# Patient Record
Sex: Male | Born: 1985 | Race: Black or African American | Hispanic: No | Marital: Married | Smoking: Former smoker
Health system: Southern US, Community
[De-identification: ages and names within clinical notes are randomized; demographics above are authoritative.]

## PROBLEM LIST (undated history)

## (undated) DIAGNOSIS — J45909 Unspecified asthma, uncomplicated: Secondary | ICD-10-CM

## (undated) DIAGNOSIS — J40 Bronchitis, not specified as acute or chronic: Secondary | ICD-10-CM

## (undated) DIAGNOSIS — J189 Pneumonia, unspecified organism: Secondary | ICD-10-CM

## (undated) HISTORY — DX: Pneumonia, unspecified organism: J18.9

## (undated) HISTORY — DX: Bronchitis, not specified as acute or chronic: J40

---
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
## 2006-12-13 ENCOUNTER — Emergency Department (HOSPITAL_COMMUNITY): Admission: EM | Admit: 2006-12-13 | Discharge: 2006-12-13 | Payer: Self-pay | Admitting: Emergency Medicine

## 2007-08-26 ENCOUNTER — Emergency Department (HOSPITAL_COMMUNITY): Admission: EM | Admit: 2007-08-26 | Discharge: 2007-08-26 | Payer: Self-pay | Admitting: Emergency Medicine

## 2007-10-17 ENCOUNTER — Emergency Department (HOSPITAL_COMMUNITY): Admission: EM | Admit: 2007-10-17 | Discharge: 2007-10-17 | Payer: Self-pay | Admitting: Emergency Medicine

## 2008-06-30 ENCOUNTER — Emergency Department (HOSPITAL_COMMUNITY): Admission: EM | Admit: 2008-06-30 | Discharge: 2008-06-30 | Payer: Self-pay | Admitting: Emergency Medicine

## 2009-06-11 ENCOUNTER — Emergency Department (HOSPITAL_COMMUNITY): Admission: EM | Admit: 2009-06-11 | Discharge: 2009-06-11 | Payer: Self-pay | Admitting: Emergency Medicine

## 2010-09-10 ENCOUNTER — Emergency Department (HOSPITAL_COMMUNITY)
Admission: EM | Admit: 2010-09-10 | Discharge: 2010-09-10 | Discharge status: Against Medical Advice | Payer: Self-pay | Attending: Emergency Medicine | Admitting: Emergency Medicine

## 2010-09-10 DIAGNOSIS — R109 Unspecified abdominal pain: Secondary | ICD-10-CM | POA: Insufficient documentation

## 2011-04-01 LAB — BASIC METABOLIC PANEL
BUN: 17
CO2: 26
Calcium: 10.1
Chloride: 105
Creatinine, Ser: 0.88
GFR calc Af Amer: >60
GFR calc non Af Amer: >60
Glucose, Bld: 95
Potassium: 4.2
Sodium: 140

## 2011-04-01 LAB — DIFFERENTIAL
Basophils Absolute: 0
Basophils Relative: 0
Eosinophils Absolute: 0.1
Eosinophils Relative: 1
Lymphocytes Relative: 13
Lymphs Abs: 0.9
Monocytes Absolute: 0.1
Monocytes Relative: 1 — ABNORMAL LOW
Neutro Abs: 5.8
Neutrophils Relative %: 85 — ABNORMAL HIGH

## 2011-04-01 LAB — URINALYSIS, ROUTINE W REFLEX MICROSCOPIC
Glucose, UA: NEGATIVE
Hgb urine dipstick: NEGATIVE
Ketones, ur: 40 — AB
Leukocytes, UA: NEGATIVE
Nitrite: NEGATIVE
Protein, ur: 30 — AB
Specific Gravity, Urine: 1.03
Urobilinogen, UA: 0.2
pH: 5.5

## 2011-04-01 LAB — CBC
HCT: 40.8
Hemoglobin: 14.1
MCHC: 34.5
MCV: 87.7
Platelets: 266
RBC: 4.66
RDW: 15.2
WBC: 6.9

## 2011-04-01 LAB — URINE MICROSCOPIC-ADD ON

## 2011-04-11 LAB — URINALYSIS, ROUTINE W REFLEX MICROSCOPIC
Bilirubin Urine: NEGATIVE
Glucose, UA: NEGATIVE mg/dL
Hgb urine dipstick: NEGATIVE
Ketones, ur: NEGATIVE mg/dL
Nitrite: NEGATIVE
Protein, ur: NEGATIVE mg/dL
Specific Gravity, Urine: 1.029 (ref 1.005–1.030)
Urobilinogen, UA: 1 mg/dL (ref 0.0–1.0)
pH: 6 (ref 5.0–8.0)

## 2011-04-11 LAB — CBC
HCT: 35.2 % — ABNORMAL LOW (ref 39.0–52.0)
Hemoglobin: 11.9 g/dL — ABNORMAL LOW (ref 13.0–17.0)
MCHC: 33.7 g/dL (ref 30.0–36.0)
MCV: 92.2 fL (ref 78.0–100.0)
Platelets: 201 10*3/uL (ref 150–400)
RBC: 3.82 MIL/uL — ABNORMAL LOW (ref 4.22–5.81)
RDW: 13.7 % (ref 11.5–15.5)
WBC: 7.4 10*3/uL (ref 4.0–10.5)

## 2011-04-11 LAB — URINE MICROSCOPIC-ADD ON

## 2011-04-11 LAB — DIFFERENTIAL
Basophils Absolute: 0 10*3/uL (ref 0.0–0.1)
Basophils Relative: 1 % (ref 0–1)
Eosinophils Absolute: 0.1 10*3/uL (ref 0.0–0.7)
Eosinophils Relative: 1 % (ref 0–5)
Lymphocytes Relative: 31 % (ref 12–46)
Lymphs Abs: 2.2 10*3/uL (ref 0.7–4.0)
Monocytes Absolute: 0.4 10*3/uL (ref 0.1–1.0)
Monocytes Relative: 6 % (ref 3–12)
Neutro Abs: 4.5 10*3/uL (ref 1.7–7.7)
Neutrophils Relative %: 62 % (ref 43–77)

## 2011-04-11 LAB — URINE CULTURE: Colony Count: 3000

## 2011-04-11 LAB — COMPREHENSIVE METABOLIC PANEL
ALT: 10 U/L (ref 0–53)
AST: 16 U/L (ref 0–37)
Albumin: 4.1 g/dL (ref 3.5–5.2)
Alkaline Phosphatase: 56 U/L (ref 39–117)
BUN: 18 mg/dL (ref 6–23)
CO2: 25 mEq/L (ref 19–32)
Calcium: 9.4 mg/dL (ref 8.4–10.5)
Chloride: 107 mEq/L (ref 96–112)
Creatinine, Ser: 0.79 mg/dL (ref 0.4–1.5)
GFR calc Af Amer: >60 mL/min (ref >60)
GFR calc non Af Amer: >60 mL/min (ref >60)
Glucose, Bld: 125 mg/dL — ABNORMAL HIGH (ref 70–99)
Potassium: 3.5 mEq/L (ref 3.5–5.1)
Sodium: 136 mEq/L (ref 135–145)
Total Bilirubin: 0.4 mg/dL (ref 0.3–1.2)
Total Protein: 6.6 g/dL (ref 6.0–8.3)

## 2011-04-11 LAB — GC/CHLAMYDIA PROBE AMP, GENITAL
Chlamydia, DNA Probe: NEGATIVE
GC Probe Amp, Genital: NEGATIVE

## 2011-04-24 LAB — GC/CHLAMYDIA PROBE AMP, GENITAL
Chlamydia, DNA Probe: POSITIVE — AB
GC Probe Amp, Genital: NEGATIVE

## 2011-07-08 DIAGNOSIS — J189 Pneumonia, unspecified organism: Secondary | ICD-10-CM

## 2011-07-08 HISTORY — DX: Pneumonia, unspecified organism: J18.9

## 2012-03-13 ENCOUNTER — Emergency Department (HOSPITAL_COMMUNITY)
Admission: EM | Admit: 2012-03-13 | Discharge: 2012-03-13 | Disposition: A | Discharge status: Home / Self Care | Payer: Self-pay | Attending: Emergency Medicine | Admitting: Emergency Medicine

## 2012-03-13 ENCOUNTER — Encounter (HOSPITAL_COMMUNITY): Payer: Self-pay | Admitting: Emergency Medicine

## 2012-03-13 ENCOUNTER — Emergency Department (HOSPITAL_COMMUNITY): Payer: Self-pay

## 2012-03-13 DIAGNOSIS — J189 Pneumonia, unspecified organism: Secondary | ICD-10-CM | POA: Insufficient documentation

## 2012-03-13 DIAGNOSIS — F17200 Nicotine dependence, unspecified, uncomplicated: Secondary | ICD-10-CM | POA: Insufficient documentation

## 2012-03-13 MED ORDER — AZITHROMYCIN 250 MG PO TABS: 250.0000 mg | ORAL_TABLET | Freq: Every day | ORAL | Status: AC

## 2012-03-13 NOTE — ED Notes (Signed)
Pt describes low back pain, general malaise, nausea w/o emesis, cough, right side of chest is sore from coughing, headache off and on. Sx started last Sunday. Rx w/ ibuprofen only w/o relief

## 2012-03-13 NOTE — ED Provider Notes (Signed)
History     CSN: [id number]  Arrival date & time 03/13/12  1543   First MD Initiated Contact with Patient 03/13/12 1813      Chief Complaint  Patient presents with  . Influenza    (Consider location/radiation/quality/duration/timing/severity/associated sxs/prior treatment) HPI Comments: Patient presents with a one week history of general malaise, cough, and right side chest pain. Patient reports symptoms starting one week ago and progressively worsening. He reports taking ibuprofen with some relief. He reports associated nausea and headache. He denies recent hospitalization or sick contacts.    Patient is a 26 y.o. male presenting with flu symptoms.  Influenza Associated symptoms include chest pain, coughing, fatigue, headaches and nausea.    History reviewed. No pertinent past medical history.  History reviewed. No pertinent past surgical history.  No family history on file.  History  Substance Use Topics  . Smoking status: Current Some Day Smoker  . Smokeless tobacco: Never Used  . Alcohol Use: No      Review of Systems  Constitutional: Positive for fatigue.  HENT: Positive for sneezing.   Respiratory: Positive for cough and shortness of breath.   Cardiovascular: Positive for chest pain.  Gastrointestinal: Positive for nausea.  Musculoskeletal: Positive for back pain.  Neurological: Positive for headaches.  All other systems reviewed and are negative.    Allergies  Review of patient's allergies indicates no known allergies.  Home Medications   Current Outpatient Rx  Name Route Sig Dispense Refill  . IBUPROFEN 200 MG PO TABS Oral Take 400 mg by mouth every 6 (six) hours as needed. For pain    . AZITHROMYCIN 250 MG PO TABS Oral Take 1 tablet (250 mg total) by mouth daily. Take first 2 tablets together, then 1 every day until finished. 6 tablet 0    BP 125/67  Pulse 63  Temp 99.2 F (37.3 C) (Oral)  Resp 18  SpO2 98%  Physical Exam  Nursing note and  vitals reviewed. Constitutional: He is oriented to person, place, and time. He appears well-developed and well-nourished. No distress.  HENT:  Head: Normocephalic and atraumatic.  Mouth/Throat: Oropharynx is clear and moist. No oropharyngeal exudate.  Eyes: Conjunctivae are normal. Pupils are equal, round, and reactive to light. No scleral icterus.  Neck: Normal range of motion. Neck supple.  Cardiovascular: Normal rate and regular rhythm.  Exam reveals no gallop and no friction rub.   No murmur heard. Pulmonary/Chest: Effort normal. No respiratory distress. He exhibits no tenderness.       Wheezes and rales heard throughout lung fields bilaterally.   Musculoskeletal: Normal range of motion.  Lymphadenopathy:    He has no cervical adenopathy.  Neurological: He is alert and oriented to person, place, and time. Coordination normal.  Skin: Skin is warm and dry. He is not diaphoretic.  Psychiatric: He has a normal mood and affect. His behavior is normal.    ED Course  Procedures (including critical care time)  Labs Reviewed - No data to display Dg Chest 2 View  03/13/2012  *RADIOLOGY REPORT*  Clinical Data: 26 year old male with shortness of breath chest congestion and pain.  CHEST - 2 VIEW  Comparison: None.  Findings: Streaky and confluent right lower lobe pulmonary opacity. Lung volumes within normal limits.  Cardiac size and mediastinal contours are within normal limits.  Visualized tracheal air column is within normal limits.  No pneumothorax or edema.  No pleural effusion.  Elsewhere lung markings are within normal limits. No osseous abnormality identified.  IMPRESSION: Right lower lobe airspace opacity most resembles acute pneumonia.   Original Report Authenticated By: Harley Hallmark, M.D.      1. Community acquired pneumonia       MDM  7:39 PM Patient's chest xray reveals right lower lobe pneumonia. Pneumonia is most likely community acquired and will be treated with  azithromycin. No further evaluation needed at this time. Patient is otherwise healthy and stable for discharge.  Filed Vitals:   03/13/12 1846  BP: 125/67  Pulse: 63  Temp: 99.2 F (37.3 C)  Resp: [address], PA-C 03/13/12 1944

## 2012-03-13 NOTE — ED Provider Notes (Signed)
Medical screening examination/treatment/procedure(s) were performed by non-physician practitioner and as supervising physician I was immediately available for consultation/collaboration.   Raksha Wolfgang R Ysabela Keisler, MD 03/13/12 2232 

## 2012-06-20 ENCOUNTER — Encounter (HOSPITAL_COMMUNITY): Payer: Self-pay | Admitting: Emergency Medicine

## 2012-06-20 ENCOUNTER — Emergency Department (HOSPITAL_COMMUNITY)
Admission: EM | Admit: 2012-06-20 | Discharge: 2012-06-20 | Discharge status: Against Medical Advice | Payer: Self-pay | Attending: Emergency Medicine | Admitting: Emergency Medicine

## 2012-06-20 ENCOUNTER — Emergency Department (HOSPITAL_COMMUNITY)
Admission: EM | Admit: 2012-06-20 | Discharge: 2012-06-20 | Disposition: A | Discharge status: Home / Self Care | Payer: Self-pay | Attending: Emergency Medicine | Admitting: Emergency Medicine

## 2012-06-20 ENCOUNTER — Encounter (HOSPITAL_COMMUNITY): Payer: Self-pay | Admitting: *Deleted

## 2012-06-20 DIAGNOSIS — F17200 Nicotine dependence, unspecified, uncomplicated: Secondary | ICD-10-CM | POA: Insufficient documentation

## 2012-06-20 DIAGNOSIS — R110 Nausea: Secondary | ICD-10-CM | POA: Insufficient documentation

## 2012-06-20 DIAGNOSIS — J45909 Unspecified asthma, uncomplicated: Secondary | ICD-10-CM | POA: Insufficient documentation

## 2012-06-20 DIAGNOSIS — R1012 Left upper quadrant pain: Secondary | ICD-10-CM | POA: Insufficient documentation

## 2012-06-20 DIAGNOSIS — R109 Unspecified abdominal pain: Secondary | ICD-10-CM

## 2012-06-20 HISTORY — DX: Unspecified asthma, uncomplicated: J45.909

## 2012-06-20 NOTE — ED Notes (Signed)
Pt states he is having abd pain that is off and on and has had this pain for about a year or so  Pt states he missed work today because of the pain  Pt states he was here earlier today but the wait was too long and he left after he was told he missed them calling him because he was asleep  Pt states the only reason he came back was to get a note so he won't loose his job

## 2012-06-20 NOTE — ED Notes (Signed)
Window tech called patient x1 and no response.

## 2012-06-20 NOTE — ED Provider Notes (Signed)
History     CSN: [id number]  Arrival date & time 06/20/12  2212   First MD Initiated Contact with Patient 06/20/12 2243      Chief Complaint  Patient presents with  . Abdominal Pain    (Consider location/radiation/quality/duration/timing/severity/associated sxs/prior treatment) HPI   Jorge Williams. is a 26 y.o. male complaining of intermittent left upper quadrant pain described as sharp like something poking poking him, off and on for one year. Patient denies fever, nausea vomiting, change in bowel or bladder habits. Patient is in no pain right now when it comes it is approximately 6/10.   Past Medical History  Diagnosis Date  . Asthma     History reviewed. No pertinent past surgical history.  History reviewed. No pertinent family history.  History  Substance Use Topics  . Smoking status: Current Some Day Smoker -- 12 years    Types: Cigars  . Smokeless tobacco: Never Used  . Alcohol Use: No      Review of Systems  Constitutional: Negative for fever.  Respiratory: Negative for shortness of breath.   Cardiovascular: Negative for chest pain.  Gastrointestinal: Positive for abdominal pain. Negative for nausea, vomiting and diarrhea.  All other systems reviewed and are negative.    Allergies  Review of patient's allergies indicates no known allergies.  Home Medications   Current Outpatient Rx  Name  Route  Sig  Dispense  Refill  . ACETAMINOPHEN 500 MG PO TABS   Oral   Take 500 mg by mouth every 6 (six) hours as needed. For pain and headache           BP 113/64  Pulse 57  Temp 98.2 F (36.8 C) (Oral)  Resp 18  SpO2 100%  Physical Exam  Nursing note and vitals reviewed. Constitutional: He is oriented to person, place, and time. He appears well-developed and well-nourished. No distress.  HENT:  Head: Normocephalic.  Eyes: Conjunctivae normal and EOM are normal.  Cardiovascular: Normal rate.   Pulmonary/Chest: Effort normal. No stridor.   Abdominal: Soft. Bowel sounds are normal. He exhibits no distension and no mass. There is no tenderness. There is no rebound and no guarding.  Musculoskeletal: Normal range of motion.  Neurological: He is alert and oriented to person, place, and time.  Psychiatric: He has a normal mood and affect.    ED Course  Procedures (including critical care time)  Labs Reviewed - No data to display No results found.   1. Abdominal pain       MDM  Patient with intermittent left upper quadrant pain for one year no associated symptoms. Patient requests work note.   Pt verbalized understanding and agrees with care plan. Outpatient follow-up and return precautions given.           Wynetta Emery, PA-C 06/20/12 2251

## 2012-06-20 NOTE — ED Notes (Signed)
Pt reports hx of the same abdominal pain a few months ago. Reports upper left abdominal  Pain 7/10 "sharp needlelike". Pressure helps relieve the pain. Reports nausea, denies vomiting or blood in stool or urine.

## 2012-06-20 NOTE — ED Notes (Signed)
Pt called to triage for phlebotomy x2 and no response.

## 2012-06-20 NOTE — ED Provider Notes (Signed)
Medical screening examination/treatment/procedure(s) were performed by non-physician practitioner and as supervising physician I was immediately available for consultation/collaboration.  Quanesha Klimaszewski, MD 06/20/12 2345 

## 2013-12-28 ENCOUNTER — Emergency Department (HOSPITAL_COMMUNITY): Payer: Self-pay

## 2013-12-28 ENCOUNTER — Encounter (HOSPITAL_COMMUNITY): Payer: Self-pay | Admitting: Emergency Medicine

## 2013-12-28 DIAGNOSIS — R52 Pain, unspecified: Secondary | ICD-10-CM | POA: Insufficient documentation

## 2013-12-28 DIAGNOSIS — J45909 Unspecified asthma, uncomplicated: Secondary | ICD-10-CM | POA: Insufficient documentation

## 2013-12-28 DIAGNOSIS — F17200 Nicotine dependence, unspecified, uncomplicated: Secondary | ICD-10-CM | POA: Insufficient documentation

## 2013-12-28 DIAGNOSIS — R079 Chest pain, unspecified: Secondary | ICD-10-CM | POA: Insufficient documentation

## 2013-12-28 LAB — BASIC METABOLIC PANEL
BUN: 17 mg/dL (ref 6–23)
CALCIUM: 9.5 mg/dL (ref 8.4–10.5)
CO2: 23 mEq/L (ref 19–32)
CREATININE: 1.01 mg/dL (ref 0.50–1.35)
Chloride: 100 mEq/L (ref 96–112)
GFR calc Af Amer: >90 mL/min (ref >90)
Glucose, Bld: 90 mg/dL (ref 70–99)
Potassium: 4 mEq/L (ref 3.7–5.3)
SODIUM: 138 meq/L (ref 137–147)

## 2013-12-28 LAB — CBC
HCT: 33.1 % — ABNORMAL LOW (ref 39.0–52.0)
Hemoglobin: 11.5 g/dL — ABNORMAL LOW (ref 13.0–17.0)
MCH: 30.2 pg (ref 26.0–34.0)
MCHC: 34.7 g/dL (ref 30.0–36.0)
MCV: 86.9 fL (ref 78.0–100.0)
PLATELETS: 201 10*3/uL (ref 150–400)
RBC: 3.81 MIL/uL — AB (ref 4.22–5.81)
RDW: 13.6 % (ref 11.5–15.5)
WBC: 4.1 10*3/uL (ref 4.0–10.5)

## 2013-12-28 LAB — I-STAT TROPONIN, ED: TROPONIN I, POC: 0 ng/mL (ref 0.00–0.08)

## 2013-12-28 MED ORDER — ALBUTEROL SULFATE (2.5 MG/3ML) 0.083% IN NEBU
5.0000 mg | INHALATION_SOLUTION | Freq: Once | RESPIRATORY_TRACT | Status: AC
  Administered 2013-12-28: 5 mg via RESPIRATORY_TRACT
  Filled 2013-12-28: qty 6

## 2013-12-28 NOTE — ED Notes (Addendum)
Pt reports chest pressure "every morning when I wake up" x weeks. States "I wake up and I feel short of breath and chest heaviness. And then it is gone for the rest of the day." Pt also reports generalized body aches, weakness and HA x 2 days. Pt noted to have bilateral wheezing, hx of asthma. Denies N/V/D, fever, chills, urinary symptoms, congestion, blurred vision or double vision. PT in NAD. Neuro intact. AO x4.

## 2013-12-29 ENCOUNTER — Emergency Department (HOSPITAL_COMMUNITY)
Admission: EM | Admit: 2013-12-29 | Discharge: 2013-12-29 | Discharge status: Against Medical Advice | Payer: Self-pay | Attending: Emergency Medicine | Admitting: Emergency Medicine

## 2013-12-29 NOTE — ED Notes (Signed)
Instructed to come back if he started to feel worse.  Stated he was feeling much better

## 2014-09-19 ENCOUNTER — Encounter (HOSPITAL_COMMUNITY): Payer: Self-pay | Admitting: Emergency Medicine

## 2014-09-19 ENCOUNTER — Emergency Department (INDEPENDENT_AMBULATORY_CARE_PROVIDER_SITE_OTHER)
Admission: EM | Admit: 2014-09-19 | Discharge: 2014-09-19 | Disposition: A | Discharge status: Home / Self Care | Payer: Self-pay | Source: Home / Self Care | Attending: Family Medicine | Admitting: Family Medicine

## 2014-09-19 DIAGNOSIS — K589 Irritable bowel syndrome without diarrhea: Secondary | ICD-10-CM

## 2014-09-19 NOTE — Discharge Instructions (Signed)
USE  Align probiotic and fiber product of choice to help abdominal problem daily. See specialist if further problems.

## 2014-09-19 NOTE — ED Notes (Signed)
Reports severe, generalized abdominal pain this am. NO pain now.  Patient also reports he has had vomiting episodes last week ( Sunday-Thursday).  No vomiting.  No pain presently

## 2014-09-19 NOTE — ED Provider Notes (Signed)
CSN: [id number]     Arrival date & time 09/19/14  [id number] History   First MD Initiated Contact with Patient 09/19/14 1038     Chief Complaint  Patient presents with  . Abdominal Pain   (Consider location/radiation/quality/duration/timing/severity/associated sxs/prior Treatment) Patient is a 29 y.o. male presenting with abdominal pain. The history is provided by the patient and the spouse.  Abdominal Pain Pain location:  Generalized Pain quality: cramping and shooting   Pain radiates to:  Does not radiate Pain severity:  Moderate Onset quality:  Sudden Duration:  2 hours Progression:  Resolved Chronicity:  Chronic Context comment:  Mult ER visits for same with neg results, sx currently resolved. Relieved by:  None tried Worsened by:  Nothing tried Associated symptoms: constipation and diarrhea   Associated symptoms: no anorexia, no chest pain, no fever, no hematemesis, no hematochezia, no melena, no nausea and no vomiting     Past Medical History  Diagnosis Date  . Asthma    History reviewed. No pertinent past surgical history. No family history on file. History  Substance Use Topics  . Smoking status: Never Smoker   . Smokeless tobacco: Not on file  . Alcohol Use: Yes    Review of Systems  Constitutional: Negative.  Negative for fever.  Cardiovascular: Negative for chest pain.  Gastrointestinal: Positive for abdominal pain, diarrhea and constipation. Negative for nausea, vomiting, melena, hematochezia, anorexia and hematemesis.  Genitourinary: Negative.     Allergies  Review of patient's allergies indicates no known allergies.  Home Medications   Prior to Admission medications   Medication Sig Start Date End Date Taking? Authorizing Provider  acetaminophen (TYLENOL) 500 MG tablet Take 500 mg by mouth every 6 (six) hours as needed. For pain and headache    Historical Provider, MD   BP 108/75 mmHg  Pulse 63  Temp(Src) 97.1 F (36.2 C) (Oral)  Resp 14  SpO2  100% Physical Exam  ED Course  Procedures (including critical care time) Labs Review Labs Reviewed - No data to display  Imaging Review No results found.   MDM   1. IBS (irritable bowel syndrome)        Linna HoffJames D Kindl, MD 09/26/14 [phone]

## 2014-09-20 ENCOUNTER — Encounter (HOSPITAL_COMMUNITY): Payer: Self-pay | Admitting: Emergency Medicine

## 2015-04-26 ENCOUNTER — Encounter (HOSPITAL_COMMUNITY): Payer: Self-pay | Admitting: Neurology

## 2015-04-26 ENCOUNTER — Emergency Department (HOSPITAL_COMMUNITY)
Admission: EM | Admit: 2015-04-26 | Discharge: 2015-04-26 | Disposition: A | Discharge status: Home / Self Care | Payer: Self-pay | Attending: Emergency Medicine | Admitting: Emergency Medicine

## 2015-04-26 DIAGNOSIS — Z720 Tobacco use: Secondary | ICD-10-CM | POA: Insufficient documentation

## 2015-04-26 DIAGNOSIS — K029 Dental caries, unspecified: Secondary | ICD-10-CM | POA: Insufficient documentation

## 2015-04-26 DIAGNOSIS — J45909 Unspecified asthma, uncomplicated: Secondary | ICD-10-CM | POA: Insufficient documentation

## 2015-04-26 DIAGNOSIS — K0889 Other specified disorders of teeth and supporting structures: Secondary | ICD-10-CM

## 2015-04-26 MED ORDER — IBUPROFEN 800 MG PO TABS: 800.0000 mg | ORAL_TABLET | Freq: Three times a day (TID) | ORAL | Status: DC

## 2015-04-26 MED ORDER — PENICILLIN V POTASSIUM 500 MG PO TABS: 500.0000 mg | ORAL_TABLET | Freq: Four times a day (QID) | ORAL | Status: DC

## 2015-04-26 NOTE — ED Notes (Signed)
Pt reports pain to right upper wisdom tooth, is painful and has a hole where he can stick his finger. Pain hurts to open mouth

## 2015-04-26 NOTE — ED Provider Notes (Signed)
CSN: [id number]     Arrival date & time 04/26/15  1513 History  By signing my name below, I, Jorge Williams, attest that this documentation has been prepared under the direction and in the presence of Shawn Joy, PA-C. Electronically Signed: Lyndel Williams, ED Scribe. 04/26/2015. 4:07 PM.   Chief Complaint  Patient presents with  . Dental Pain    The history is provided by the patient. No language interpreter was used.   HPI Comments: Jorge Williams. is a 29 y.o. male who presents to the Emergency Department complaining of sudden onset, constant, sharp, 10/10 right, upper, posterior dental pain onset 1 day ago that radiates through right side of face. Pt attributes his pain to decaying of his right upper wisdom tooth that has been present for several months. He associates a mild headache. Pt reports the pain is worse with ROM of jaw. He is not followed by a dentist and has never had dental surgery. The pt took a Goody powder yesterday without significant relief of pain. Denies trismus, trouble swallowing, bleeding or discharge from the affected area, fevers or chills, nausea or vomiting.   Past Medical History  Diagnosis Date  . Asthma    History reviewed. No pertinent past surgical history. No family history on file. Social History  Substance Use Topics  . Smoking status: Light Tobacco Smoker  . Smokeless tobacco: None  . Alcohol Use: Yes    Review of Systems  Constitutional: Negative for fever and chills.  HENT: Positive for dental problem. Negative for trouble swallowing.   Gastrointestinal: Negative for nausea and vomiting.  Neurological: Positive for headaches.  All other systems reviewed and are negative.  Allergies  Review of patient's allergies indicates no known allergies.  Home Medications   Prior to Admission medications   Medication Sig Start Date End Date Taking? Authorizing Provider  acetaminophen (TYLENOL) 500 MG tablet Take 500 mg by mouth every 6 (six)  hours as needed. For pain and headache    Historical Provider, MD  ibuprofen (ADVIL,MOTRIN) 800 MG tablet Take 1 tablet (800 mg total) by mouth 3 (three) times daily. 04/26/15   Shawn C Joy, PA-C  penicillin v potassium (VEETID) 500 MG tablet Take 1 tablet (500 mg total) by mouth 4 (four) times daily. 04/26/15   Shawn C Joy, PA-C   BP 118/77 mmHg  Pulse 51  Temp(Src) 98.7 F (37.1 C) (Oral)  Resp 16  SpO2 98% Physical Exam  Constitutional: He is oriented to person, place, and time. He appears well-developed and well-nourished. No distress.  HENT:  Head: Normocephalic.  Minor swelling to right most rear, upper molar, obvious cavity to tooth, no open sores, bleeding or discharge.   Eyes: Conjunctivae are normal.  Neck: Normal range of motion. Neck supple.  Cardiovascular: Normal rate.   Pulmonary/Chest: Effort normal. No respiratory distress.  Musculoskeletal: Normal range of motion.  Neurological: He is alert and oriented to person, place, and time. Coordination normal.  No neurologic deficits. No facial droop.  Skin: Skin is warm.  Psychiatric: He has a normal mood and affect. His behavior is normal.  Nursing note and vitals reviewed.   ED Course  Procedures  DIAGNOSTIC STUDIES: Oxygen Saturation is 98% on RA, normal by my interpretation.    COORDINATION OF CARE: 4:05 PM Discussed treatment plan with pt at bedside and pt agreed to plan. Will prescribe ibuprofen.    MDM   Final diagnoses:  Pain, dental    Generoso E Criss Alvine.  presents with right sided upper dental pain with no signs of systemic infection.  Pt does not have any signs of neurologic deficits or systemic infection. Recommended that pt seek treatment from a dentist. No indication of abscess, breathing problems, or swallowing difficulty. Pt discharged with ibuprofen and Penicillin and resources to select a dentist. Pt advised to seek dental treatment as soon as possible.   I personally performed the services  described in this documentation, which was scribed in my presence. The recorded information has been reviewed and is accurate.    Anselm PancoastShawn C Joy, PA-C 04/26/15 1710  Lorre NickAnthony Allen, MD 04/27/15 [phone]

## 2015-04-26 NOTE — ED Notes (Signed)
Patient is alert and orientedx4.  Patient was explained discharge instructions and they understood them with no questions.   

## 2015-04-26 NOTE — Discharge Instructions (Signed)
You have been seen today for dental pain. You have been discharged with prescriptions for ibuprofen and an antibiotic.  It is recommended that you follow up with a dentist as soon as possible. Return to ED should symptoms worsen or other concerns arise.

## 2015-05-09 ENCOUNTER — Emergency Department (HOSPITAL_COMMUNITY): Payer: Self-pay

## 2015-05-09 ENCOUNTER — Emergency Department (HOSPITAL_COMMUNITY)
Admission: EM | Admit: 2015-05-09 | Discharge: 2015-05-10 | Disposition: A | Discharge status: Home / Self Care | Payer: Self-pay | Attending: Emergency Medicine | Admitting: Emergency Medicine

## 2015-05-09 ENCOUNTER — Encounter (HOSPITAL_COMMUNITY): Payer: Self-pay | Admitting: Emergency Medicine

## 2015-05-09 DIAGNOSIS — Z792 Long term (current) use of antibiotics: Secondary | ICD-10-CM | POA: Insufficient documentation

## 2015-05-09 DIAGNOSIS — Z79899 Other long term (current) drug therapy: Secondary | ICD-10-CM | POA: Insufficient documentation

## 2015-05-09 DIAGNOSIS — J45901 Unspecified asthma with (acute) exacerbation: Secondary | ICD-10-CM | POA: Insufficient documentation

## 2015-05-09 DIAGNOSIS — Z720 Tobacco use: Secondary | ICD-10-CM | POA: Insufficient documentation

## 2015-05-09 MED ORDER — ALBUTEROL (5 MG/ML) CONTINUOUS INHALATION SOLN
15.0000 mg/h | INHALATION_SOLUTION | RESPIRATORY_TRACT | Status: DC
  Administered 2015-05-09: 15 mg/h via RESPIRATORY_TRACT
  Filled 2015-05-09: qty 20

## 2015-05-09 MED ORDER — PREDNISONE 20 MG PO TABS
60.0000 mg | ORAL_TABLET | Freq: Once | ORAL | Status: DC
  Filled 2015-05-09: qty 3

## 2015-05-09 MED ORDER — IPRATROPIUM-ALBUTEROL 0.5-2.5 (3) MG/3ML IN SOLN
3.0000 mL | Freq: Once | RESPIRATORY_TRACT | Status: AC
  Administered 2015-05-09: 3 mL via RESPIRATORY_TRACT
  Filled 2015-05-09: qty 3

## 2015-05-09 MED ORDER — DEXAMETHASONE SODIUM PHOSPHATE 10 MG/ML IJ SOLN
10.0000 mg | Freq: Once | INTRAMUSCULAR | Status: AC
  Administered 2015-05-10: 10 mg via INTRAMUSCULAR
  Filled 2015-05-09: qty 1

## 2015-05-09 NOTE — ED Notes (Signed)
Pt returned from radiology, sat 88%, pt appears very shob, duoneb tx started.

## 2015-05-09 NOTE — ED Provider Notes (Signed)
CSN: [id number]     Arrival date & time 05/09/15  2114 History  By signing my name below, I, Jorge Williams, attest that this documentation has been prepared under the direction and in the presence of Mckinley Olheiser, MD. Electronically Signed: Angelene Giovanni, ED Scribe. 05/09/2015. 11:22 PM.      Chief Complaint  Patient presents with  . Shortness of Breath   Patient is a 29 y.o. male presenting with shortness of breath. The history is provided by the patient. No language interpreter was used.  Shortness of Breath Severity:  Severe Onset quality:  Sudden Timing:  Intermittent Progression:  Worsening Chronicity:  Chronic Context: weather changes   Relieved by:  None tried Worsened by:  Nothing tried Ineffective treatments:  None tried Associated symptoms: sore throat and wheezing   Associated symptoms: no chest pain and no cough   Risk factors: tobacco use   Risk factors: no family hx of DVT, no prolonged immobilization and no recent surgery    HPI Comments: Jorge Williams. is a 29 y.o. male who presents to the Emergency Department complaining of gradually worsening sudden onset of intermittent wheezing and SOB onset this am. He reports associated generalized myalgia and sore throat. He reports that he normally has an asthma exacerbation with the season change. He denies ever being intubated for his asthma and normally just receives breathing treatments here. He denies any long travels. He reports that he is a current everyday smoker.   Past Medical History  Diagnosis Date  . Asthma    History reviewed. No pertinent past surgical history. No family history on file. Social History  Substance Use Topics  . Smoking status: Light Tobacco Smoker    Types: Cigars  . Smokeless tobacco: None  . Alcohol Use: Yes    Review of Systems  HENT: Positive for sore throat.   Respiratory: Positive for shortness of breath and wheezing. Negative for cough.   Cardiovascular: Negative  for chest pain.  Musculoskeletal: Positive for myalgias.  All other systems reviewed and are negative.     Allergies  Review of patient's allergies indicates no known allergies.  Home Medications   Prior to Admission medications   Medication Sig Start Date End Date Taking? Authorizing Provider  albuterol (PROVENTIL) (2.5 MG/3ML) 0.083% nebulizer solution Take 2.5 mg by nebulization every 6 (six) hours as needed for wheezing or shortness of breath.   Yes Historical Provider, MD  naproxen sodium (ANAPROX) 220 MG tablet Take 440 mg by mouth 3 (three) times daily as needed (for pain.).   Yes Historical Provider, MD  penicillin v potassium (VEETID) 500 MG tablet Take 1 tablet (500 mg total) by mouth 4 (four) times daily. 04/26/15  Yes Shawn C Joy, PA-C  ibuprofen (ADVIL,MOTRIN) 800 MG tablet Take 1 tablet (800 mg total) by mouth 3 (three) times daily. Patient not taking: Reported on 05/09/2015 04/26/15   Shawn C Joy, PA-C   BP 134/81 mmHg  Pulse 83  Temp(Src) 99.5 F (37.5 C) (Oral)  Resp 14  Ht  (1.854 m)  Wt 135 lb (61.236 kg)  BMI 17.82 kg/m2  SpO2 97% Physical Exam  Constitutional: He is oriented to person, place, and time. He appears well-developed and well-nourished. No distress.  HENT:  Head: Normocephalic and atraumatic.  Mouth/Throat: Oropharynx is clear and moist.  Eyes: Conjunctivae and EOM are normal. Pupils are equal, round, and reactive to light.  Neck: Normal range of motion. Neck supple. No tracheal deviation present.  Cardiovascular: Normal rate and regular rhythm.   Pulmonary/Chest: Effort normal. No stridor. No respiratory distress. He has wheezes. He has no rales. He exhibits no tenderness.  Occasional expiratory wheeze  Abdominal: Soft. Bowel sounds are normal. There is no rebound and no guarding.  Musculoskeletal: Normal range of motion. He exhibits no edema.  Neurological: He is alert and oriented to person, place, and time.  Skin: Skin is warm and dry.   Psychiatric: He has a normal mood and affect. His behavior is normal.  Nursing note and vitals reviewed.   ED Course  Procedures (including critical care time) DIAGNOSTIC STUDIES: Oxygen Saturation is 97% on RA, adequate by my interpretation.    COORDINATION OF CARE: 11:19 PM- Pt advised of plan for treatment and pt agrees. Pt will receive Prednisone.    Labs Review Labs Reviewed - No data to display  Imaging Review Dg Chest 2 View  05/09/2015  CLINICAL DATA:  Chest heaviness, dyspnea, nonproductive cough. Onset yesterday EXAM: CHEST  2 VIEW COMPARISON:  12/28/2013 FINDINGS: The heart size and mediastinal contours are within normal limits. Both lungs are clear. The visualized skeletal structures are unremarkable. IMPRESSION: No active cardiopulmonary disease. Electronically Signed   By: Ellery Plunkaniel R Mitchell M.D.   On: 05/09/2015 21:40   Jaykob Minichiello, MD has personally reviewed and evaluated these images and lab results as part of her medical decision-making.   EKG Interpretation   Date/Time:  Wednesday May 09 2015 22:28:11 EDT Ventricular Rate:  80 PR Interval:  169 QRS Duration: 96 QT Interval:  356 QTC Calculation: 411 R Axis:   95 Text Interpretation:  Sinus rhythm Confirmed by [facility]  MD, Herminio Kniskern  ([id number]) on 05/09/2015 11:05:41 PM      MDM   Final diagnoses:  None    Results for orders placed or performed during the hospital encounter of 12/29/13  CBC  Result Value Ref Range   WBC 4.1 4.0 - 10.5 K/uL   RBC 3.81 (L) 4.22 - 5.81 MIL/uL   Hemoglobin 11.5 (L) 13.0 - 17.0 g/dL   HCT 29.533.1 (L) 62.139.0 - 30.852.0 %   MCV 86.9 78.0 - 100.0 fL   MCH 30.2 26.0 - 34.0 pg   MCHC 34.7 30.0 - 36.0 g/dL   RDW 65.713.6 84.611.5 - 96.215.5 %   Platelets 201 150 - 400 K/uL  Basic metabolic panel  Result Value Ref Range   Sodium 138 137 - 147 mEq/L   Potassium 4.0 3.7 - 5.3 mEq/L   Chloride 100 96 - 112 mEq/L   CO2 23 19 - 32 mEq/L   Glucose, Bld 90 70 - 99 mg/dL   BUN 17 6 - 23  mg/dL   Creatinine, Ser 9.521.01 0.50 - 1.35 mg/dL   Calcium 9.5 8.4 - 84.110.5 mg/dL   GFR calc non Af Amer >90 >90 mL/min   GFR calc Af Amer >90 >90 mL/min  I-stat troponin, ED ([facility])  Result Value Ref Range   Troponin i, poc 0.00 0.00 - 0.08 ng/mL   Comment 3           Dg Chest 2 View  05/09/2015  CLINICAL DATA:  Chest heaviness, dyspnea, nonproductive cough. Onset yesterday EXAM: CHEST  2 VIEW COMPARISON:  12/28/2013 FINDINGS: The heart size and mediastinal contours are within normal limits. Both lungs are clear. The visualized skeletal structures are unremarkable. IMPRESSION: No active cardiopulmonary disease. Electronically Signed   By: Ellery Plunkaniel R Mitchell M.D.   On: 05/09/2015 21:40  Medications  albuterol (PROVENTIL,VENTOLIN) solution continuous neb (0 mg/hr Nebulization Stopped 05/10/15 0005)  ipratropium-albuterol (DUONEB) 0.5-2.5 (3) MG/3ML nebulizer solution 3 mL (3 mLs Nebulization Given 05/09/15 2145)  dexamethasone (DECADRON) injection 10 mg (10 mg Intramuscular Given 05/10/15 0002)  albuterol (PROVENTIL) (2.5 MG/3ML) 0.083% nebulizer solution 5 mg (5 mg Nebulization Given 05/10/15 0155)  ketorolac (TORADOL) injection 60 mg (60 mg Intramuscular Given 05/10/15 0155)   Clear post nebs and feels much better   Advised to stop smoking and stop marijuana.  Will give RX for inhaler and steroids.    I personally performed the services described in this documentation, which was scribed in my presence. The recorded information has been reviewed and is accurate.    Cy Blamer, MD 05/10/15 [phone]

## 2015-05-09 NOTE — ED Notes (Addendum)
Pt presents with SHOB, hx of asthma, +sore throat, cough,  accessory muscle usage noted. Speaking short sentences

## 2015-05-09 NOTE — ED Provider Notes (Signed)
10:49 PM Reviewing patients waiting to be seen. Initial o2 sat noted. Unfortunately has been waiting for extended period of time. On exam he looks fairly well though. Appeared to be sleeping when I entered room. o2 sat was 93% on RA. Mild tachypnea, but no accessory muscle usage. B/l wheezing. Continuous neb and steroids ordered.   Raeford RazorStephen Elliemae Braman, MD 05/09/15 2251

## 2015-05-10 ENCOUNTER — Encounter (HOSPITAL_COMMUNITY): Payer: Self-pay | Admitting: Emergency Medicine

## 2015-05-10 MED ORDER — ALBUTEROL SULFATE HFA 108 (90 BASE) MCG/ACT IN AERS: 1.0000 | INHALATION_SPRAY | Freq: Four times a day (QID) | RESPIRATORY_TRACT | Status: DC | PRN

## 2015-05-10 MED ORDER — KETOROLAC TROMETHAMINE 60 MG/2ML IM SOLN
60.0000 mg | Freq: Once | INTRAMUSCULAR | Status: AC
  Administered 2015-05-10: 60 mg via INTRAMUSCULAR
  Filled 2015-05-10: qty 2

## 2015-05-10 MED ORDER — PREDNISONE 20 MG PO TABS: ORAL_TABLET | ORAL | Status: DC

## 2015-05-10 MED ORDER — ALBUTEROL SULFATE (2.5 MG/3ML) 0.083% IN NEBU
5.0000 mg | INHALATION_SOLUTION | Freq: Once | RESPIRATORY_TRACT | Status: AC
  Administered 2015-05-10: 5 mg via RESPIRATORY_TRACT
  Filled 2015-05-10: qty 6

## 2015-05-10 NOTE — Discharge Instructions (Signed)

## 2015-07-06 ENCOUNTER — Emergency Department (HOSPITAL_COMMUNITY)
Admission: EM | Admit: 2015-07-06 | Discharge: 2015-07-07 | Disposition: A | Discharge status: Home / Self Care | Payer: Self-pay | Attending: Emergency Medicine | Admitting: Emergency Medicine

## 2015-07-06 DIAGNOSIS — J45909 Unspecified asthma, uncomplicated: Secondary | ICD-10-CM | POA: Insufficient documentation

## 2015-07-06 DIAGNOSIS — Z79899 Other long term (current) drug therapy: Secondary | ICD-10-CM | POA: Insufficient documentation

## 2015-07-06 DIAGNOSIS — H109 Unspecified conjunctivitis: Secondary | ICD-10-CM | POA: Insufficient documentation

## 2015-07-06 DIAGNOSIS — J029 Acute pharyngitis, unspecified: Secondary | ICD-10-CM | POA: Insufficient documentation

## 2015-07-06 DIAGNOSIS — F17210 Nicotine dependence, cigarettes, uncomplicated: Secondary | ICD-10-CM | POA: Insufficient documentation

## 2015-07-07 ENCOUNTER — Encounter (HOSPITAL_COMMUNITY): Payer: Self-pay | Admitting: Emergency Medicine

## 2015-07-07 MED ORDER — FLUORESCEIN SODIUM 1 MG OP STRP
1.0000 | ORAL_STRIP | Freq: Once | OPHTHALMIC | Status: AC
  Administered 2015-07-07: 1 via OPHTHALMIC
  Filled 2015-07-07: qty 1

## 2015-07-07 MED ORDER — TETRACAINE HCL 0.5 % OP SOLN
1.0000 [drp] | Freq: Once | OPHTHALMIC | Status: AC
  Administered 2015-07-07: 1 [drp] via OPHTHALMIC
  Filled 2015-07-07: qty 4

## 2015-07-07 MED ORDER — ERYTHROMYCIN 5 MG/GM OP OINT
1.0000 "application " | TOPICAL_OINTMENT | Freq: Once | OPHTHALMIC | Status: AC
  Administered 2015-07-07: 1 via OPHTHALMIC
  Filled 2015-07-07: qty 3.5

## 2015-07-07 NOTE — ED Provider Notes (Signed)
CSN: [id number]     Arrival date & time 07/06/15  2355 History   First MD Initiated Contact with Patient 07/07/15 0007     Chief Complaint  Patient presents with  . Eye Pain     (Consider location/radiation/quality/duration/timing/severity/associated sxs/prior Treatment) HPI Comments: 29 year old male presents to the emergency department for evaluation of left eye your rotation. He reports that his daughter was sick with pink eye last week. He states that he has had some crusting on his lashes as well as some discharge from the eye. He denies any pain, but states that he notices a slight discomfort mostly when blinking. He has had no fever. He feels as though his left eye has become more red than normal. Patient wears glasses and denies any use of contacts. He further denies trauma or injury to the eye.  Patient is a 29 y.o. male presenting with eye pain. The history is provided by the patient. No language interpreter was used.  Eye Pain This is a new problem. The current episode started yesterday. The problem occurs constantly. The problem has been gradually worsening. Associated symptoms include a sore throat (mild). Pertinent negatives include no fever, rash, swollen glands or vomiting. Associated symptoms comments: Negative for vision changes or vision loss. He has tried nothing for the symptoms.    Past Medical History  Diagnosis Date  . Asthma    History reviewed. No pertinent past surgical history. No family history on file. Social History  Substance Use Topics  . Smoking status: Light Tobacco Smoker    Types: Cigars  . Smokeless tobacco: None  . Alcohol Use: Yes    Review of Systems  Constitutional: Negative for fever.  HENT: Positive for sore throat (mild).   Eyes: Positive for discharge and redness. Negative for photophobia and visual disturbance.  Gastrointestinal: Negative for vomiting.  Skin: Negative for rash.  All other systems reviewed and are  negative.   Allergies  Food  Home Medications   Prior to Admission medications   Medication Sig Start Date End Date Taking? Authorizing Provider  albuterol (PROVENTIL HFA;VENTOLIN HFA) 108 (90 BASE) MCG/ACT inhaler Inhale 1-2 puffs into the lungs every 6 (six) hours as needed for wheezing or shortness of breath. 05/10/15  Yes April Palumbo, MD  albuterol (PROVENTIL) (2.5 MG/3ML) 0.083% nebulizer solution Take 2.5 mg by nebulization every 6 (six) hours as needed for wheezing or shortness of breath.   Yes Historical Provider, MD  ibuprofen (ADVIL,MOTRIN) 800 MG tablet Take 1 tablet (800 mg total) by mouth 3 (three) times daily. Patient not taking: Reported on 05/09/2015 04/26/15   Shawn C Joy, PA-C  penicillin v potassium (VEETID) 500 MG tablet Take 1 tablet (500 mg total) by mouth 4 (four) times daily. Patient not taking: Reported on 07/07/2015 04/26/15   Hillard DankerShawn C Joy, PA-C  predniSONE (DELTASONE) 20 MG tablet 3 tabs po day one, then 2 po daily x 4 days Patient not taking: Reported on 07/07/2015 05/10/15   April Palumbo, MD   BP 102/89 mmHg  Pulse 60  Temp(Src) 97.5 F (36.4 C) (Oral)  Resp 18  Ht 6\' 1"  (1.854 m)  Wt 63.504 kg  BMI 18.47 kg/m2  SpO2 100%   Physical Exam  Constitutional: He is oriented to person, place, and time. He appears well-developed and well-nourished. No distress.  Nontoxic/nonseptic appearing  HENT:  Head: Normocephalic and atraumatic.  Oropharynx clear. No erythema or palatal petechiae.  Eyes: EOM are normal. Pupils are equal, round, and reactive to  light. Left eye exhibits no discharge. No foreign body present in the left eye. Left conjunctiva is injected (very mild). No scleral icterus. Left eye exhibits normal extraocular motion.  Fundoscopic exam:      The left eye shows red reflex.  Slit lamp exam:      The right eye shows no fluorescein uptake.       The left eye shows no corneal abrasion, no corneal flare and no corneal ulcer.  PERRL. No proptosis  or hyphema. EOMs normal without nystagmus. No pain with eye movement. No consensual photophobia. Negative Seidel sign. No uptake on fluorescein staining of the R eye. Intraocular pressure is 15 with 95% confidence in the left eye.  Neck: Normal range of motion.  Pulmonary/Chest: Effort normal. No respiratory distress.  Musculoskeletal: Normal range of motion.  Neurological: He is alert and oriented to person, place, and time.  Skin: Skin is warm and dry. No rash noted. He is not diaphoretic. No erythema. No pallor.  Psychiatric: He has a normal mood and affect. His behavior is normal.  Nursing note and vitals reviewed.   ED Course  Procedures (including critical care time) Labs Review Labs Reviewed - No data to display  Imaging Review No results found.   I have personally reviewed and evaluated these images and lab results as part of my medical decision-making.   EKG Interpretation None      MDM   Final diagnoses:  Conjunctivitis of left eye    29 year old male presents to the emergency department for evaluation of eye irritation and redness. He denies a trauma or injury to his eye. He reports that his daughter was sick with pink eye recently. No concern for a globe rupture or iritis or uveitis. No evidence of acute glaucoma. Extraocular movements are intact. No evidence of corneal abrasion or ulcer. Symptoms consistent with conjunctivitis. Will cover for bacterial etiology with erythromycin ointment. Return precautions given at discharge. Patient discharged in good condition with no unaddressed concerns.   Filed Vitals:   07/07/15 0003  BP: 102/89  Pulse: 60  Temp: 97.5 F (36.4 C)  TempSrc: Oral  Resp: 18  Height:  (1.854 m)  Weight: 63.504 kg  SpO2: 100%     Antony Madura, PA-C 07/07/15 1610  April Palumbo, MD 07/07/15 0100

## 2015-07-07 NOTE — Discharge Instructions (Signed)
Place 1/2 inch ribbon of erythromycin ointment in the affected eye 4 times a day. You may use cool compresses and/or ibuprofen for discomfort. Follow up with an eye doctor as needed for persistent symptoms.  Bacterial Conjunctivitis Bacterial conjunctivitis, commonly called pink eye, is an inflammation of the clear membrane that covers the white part of the eye (conjunctiva). The inflammation can also happen on the underside of the eyelids. The blood vessels in the conjunctiva become inflamed, causing the eye to become red or pink. Bacterial conjunctivitis may spread easily from one eye to another and from person to person (contagious).  CAUSES  Bacterial conjunctivitis is caused by bacteria. The bacteria may come from your own skin, your upper respiratory tract, or from someone else with bacterial conjunctivitis. SYMPTOMS  The normally white color of the eye or the underside of the eyelid is usually pink or red. The pink eye is usually associated with irritation, tearing, and some sensitivity to light. Bacterial conjunctivitis is often associated with a thick, yellowish discharge from the eye. The discharge may turn into a crust on the eyelids overnight, which causes your eyelids to stick together. If a discharge is present, there may also be some blurred vision in the affected eye. DIAGNOSIS  Bacterial conjunctivitis is diagnosed by your caregiver through an eye exam and the symptoms that you report. Your caregiver looks for changes in the surface tissues of your eyes, which may point to the specific type of conjunctivitis. A sample of any discharge may be collected on a cotton-tip swab if you have a severe case of conjunctivitis, if your cornea is affected, or if you keep getting repeat infections that do not respond to treatment. The sample will be sent to a lab to see if the inflammation is caused by a bacterial infection and to see if the infection will respond to antibiotic medicines. TREATMENT    Bacterial conjunctivitis is treated with antibiotics. Antibiotic eyedrops are most often used. However, antibiotic ointments are also available. Antibiotics pills are sometimes used. Artificial tears or eye washes may ease discomfort. HOME CARE INSTRUCTIONS   To ease discomfort, apply a cool, clean washcloth to your eye for 10-20 minutes, 3-4 times a day.  Gently wipe away any drainage from your eye with a warm, wet washcloth or a cotton ball.  Wash your hands often with soap and water. Use paper towels to dry your hands.  Do not share towels or washcloths. This may spread the infection.  Change or wash your pillowcase every day.  You should not use eye makeup until the infection is gone.  Do not operate machinery or drive if your vision is blurred.  Stop using contact lenses. Ask your caregiver how to sterilize or replace your contacts before using them again. This depends on the type of contact lenses that you use.  When applying medicine to the infected eye, do not touch the edge of your eyelid with the eyedrop bottle or ointment tube. SEEK IMMEDIATE MEDICAL CARE IF:   Your infection has not improved within 3 days after beginning treatment.  You had yellow discharge from your eye and it returns.  You have increased eye pain.  Your eye redness is spreading.  Your vision becomes blurred.  You have a fever or persistent symptoms for more than 2-3 days.  You have a fever and your symptoms suddenly get worse.  You have facial pain, redness, or swelling. MAKE SURE YOU:   Understand these instructions.  Will watch your condition.  Will get help right away if you are not doing well or get worse.   This information is not intended to replace advice given to you by your health care provider. Make sure you discuss any questions you have with your health care provider.   Document Released: 06/23/2005 Document Revised: 07/14/2014 Document Reviewed: 11/24/2011 Elsevier  Interactive Patient Education [employer]

## 2015-07-07 NOTE — ED Notes (Signed)
Pt c/o left eye irritation, swelling and pain that started yesterday. Pt states his daughter had pink eye last week.

## 2015-09-16 ENCOUNTER — Encounter (HOSPITAL_COMMUNITY): Payer: Self-pay | Admitting: Emergency Medicine

## 2015-09-16 ENCOUNTER — Emergency Department (HOSPITAL_COMMUNITY)
Admission: EM | Admit: 2015-09-16 | Discharge: 2015-09-17 | Disposition: A | Discharge status: Home / Self Care | Payer: Self-pay | Attending: Emergency Medicine | Admitting: Emergency Medicine

## 2015-09-16 DIAGNOSIS — R51 Headache: Secondary | ICD-10-CM | POA: Insufficient documentation

## 2015-09-16 DIAGNOSIS — R519 Headache, unspecified: Secondary | ICD-10-CM

## 2015-09-16 DIAGNOSIS — Z87891 Personal history of nicotine dependence: Secondary | ICD-10-CM | POA: Insufficient documentation

## 2015-09-16 DIAGNOSIS — Z79899 Other long term (current) drug therapy: Secondary | ICD-10-CM | POA: Insufficient documentation

## 2015-09-16 DIAGNOSIS — R197 Diarrhea, unspecified: Secondary | ICD-10-CM | POA: Insufficient documentation

## 2015-09-16 DIAGNOSIS — R109 Unspecified abdominal pain: Secondary | ICD-10-CM | POA: Insufficient documentation

## 2015-09-16 DIAGNOSIS — J45909 Unspecified asthma, uncomplicated: Secondary | ICD-10-CM | POA: Insufficient documentation

## 2015-09-16 NOTE — ED Notes (Signed)
Patient c/o abd pain x5 days. Mid, abd cramping. Also c/o intermittent headache. Last BM 2 days ago, initially patient experienced frequent diarrhea. C/o Nausea, no emesis. Denies fever.

## 2015-09-17 LAB — LIPASE, BLOOD: Lipase: 26 U/L (ref 11–51)

## 2015-09-17 LAB — CBC
HCT: 34.3 % — ABNORMAL LOW (ref 39.0–52.0)
HEMOGLOBIN: 11.6 g/dL — AB (ref 13.0–17.0)
MCH: 29.8 pg (ref 26.0–34.0)
MCHC: 33.8 g/dL (ref 30.0–36.0)
MCV: 88.2 fL (ref 78.0–100.0)
Platelets: 280 10*3/uL (ref 150–400)
RBC: 3.89 MIL/uL — AB (ref 4.22–5.81)
RDW: 13.6 % (ref 11.5–15.5)
WBC: 6 10*3/uL (ref 4.0–10.5)

## 2015-09-17 LAB — COMPREHENSIVE METABOLIC PANEL
ALT: 16 U/L — ABNORMAL LOW (ref 17–63)
ANION GAP: 9 (ref 5–15)
AST: 19 U/L (ref 15–41)
Albumin: 4.2 g/dL (ref 3.5–5.0)
Alkaline Phosphatase: 54 U/L (ref 38–126)
BUN: 23 mg/dL — ABNORMAL HIGH (ref 6–20)
CHLORIDE: 106 mmol/L (ref 101–111)
CO2: 28 mmol/L (ref 22–32)
CREATININE: 0.94 mg/dL (ref 0.61–1.24)
Calcium: 9.7 mg/dL (ref 8.9–10.3)
Glucose, Bld: 101 mg/dL — ABNORMAL HIGH (ref 65–99)
Potassium: 3.9 mmol/L (ref 3.5–5.1)
Sodium: 143 mmol/L (ref 135–145)
Total Bilirubin: 0.4 mg/dL (ref 0.3–1.2)
Total Protein: 6.9 g/dL (ref 6.5–8.1)

## 2015-09-17 MED ORDER — DIPHENHYDRAMINE HCL 50 MG/ML IJ SOLN
12.5000 mg | Freq: Once | INTRAMUSCULAR | Status: AC
  Administered 2015-09-17: 12.5 mg via INTRAVENOUS
  Filled 2015-09-17: qty 1

## 2015-09-17 MED ORDER — SODIUM CHLORIDE 0.9 % IV BOLUS (SEPSIS)
1000.0000 mL | Freq: Once | INTRAVENOUS | Status: AC
  Administered 2015-09-17: 1000 mL via INTRAVENOUS

## 2015-09-17 MED ORDER — KETOROLAC TROMETHAMINE 15 MG/ML IJ SOLN
15.0000 mg | Freq: Once | INTRAMUSCULAR | Status: AC
  Administered 2015-09-17: 15 mg via INTRAVENOUS
  Filled 2015-09-17: qty 1

## 2015-09-17 MED ORDER — PROCHLORPERAZINE EDISYLATE 5 MG/ML IJ SOLN
10.0000 mg | Freq: Four times a day (QID) | INTRAMUSCULAR | Status: DC | PRN
  Administered 2015-09-17: 10 mg via INTRAVENOUS
  Filled 2015-09-17: qty 2

## 2015-09-17 NOTE — Discharge Instructions (Signed)

## 2015-09-17 NOTE — ED Notes (Signed)
Pt alert and oriented prior to d/c. Spoke to wife to let her know he was on his way home. Pt gait is steady. Denies feeling drowsy.

## 2015-09-17 NOTE — ED Provider Notes (Signed)
CSN: [id number]     Arrival date & time 09/16/15  2136 History  By signing my name below, I, Linus Galas, attest that this documentation has been prepared under the direction and in the presence of Raeford Razor, MD. Electronically Signed: Linus Galas, ED Scribe. 09/17/2015. 12:33 AM.    Chief Complaint  Patient presents with  . Abdominal Pain   The history is provided by the patient. No language interpreter was used.   HPI Comments: Jorge Agyeman Hyder Deman. is a 30 y.o. male with no pertinent PMHx who presents to the Emergency Department complaining of sharp abdominal pain that began 5 days ago. Pt also reports "watery stools".  Pt states that he ate at [facility] 6 days ago and began having abdominal pain a day later. Pt currently does not have any abdominal pain here in the ED. Pt has been taking Pepto-Bismol with mild relief. Pt denies any blood in his stools, urinary symptoms or any other symptoms at this time. Pt last BM was 2 days ago.   Pt also complains of an intermittent throbbing HA. He rates his HA an 8/10 in severity. Pt has been taking Tylenol with relief. He still has a HA while in the ED. Pt denies any head injuries.   Past Medical History  Diagnosis Date  . Asthma    History reviewed. No pertinent past surgical history. History reviewed. No pertinent family history. Social History  Substance Use Topics  . Smoking status: Former Smoker    Types: Cigars  . Smokeless tobacco: None  . Alcohol Use: Yes     Comment: occ    Review of Systems  Gastrointestinal: Positive for abdominal pain and diarrhea. Negative for blood in stool.  Genitourinary: Negative for urgency, frequency and difficulty urinating.  Neurological: Positive for headaches.  All other systems reviewed and are negative.  Allergies  Food  Home Medications   Prior to Admission medications   Medication Sig Start Date End Date Taking? Authorizing Provider  acetaminophen (TYLENOL) 500 MG  tablet Take 1,000 mg by mouth every 6 (six) hours as needed (for pain.).   Yes Historical Provider, MD  albuterol (PROVENTIL HFA;VENTOLIN HFA) 108 (90 BASE) MCG/ACT inhaler Inhale 1-2 puffs into the lungs every 6 (six) hours as needed for wheezing or shortness of breath. 05/10/15  Yes April Palumbo, MD  albuterol (PROVENTIL) (2.5 MG/3ML) 0.083% nebulizer solution Take 2.5 mg by nebulization every 6 (six) hours as needed for wheezing or shortness of breath.   Yes Historical Provider, MD  ibuprofen (ADVIL,MOTRIN) 800 MG tablet Take 1 tablet (800 mg total) by mouth 3 (three) times daily. Patient not taking: Reported on 05/09/2015 04/26/15   Shawn C Joy, PA-C  penicillin v potassium (VEETID) 500 MG tablet Take 1 tablet (500 mg total) by mouth 4 (four) times daily. Patient not taking: Reported on 07/07/2015 04/26/15   Hillard Danker Joy, PA-C  predniSONE (DELTASONE) 20 MG tablet 3 tabs po day one, then 2 po daily x 4 days Patient not taking: Reported on 07/07/2015 05/10/15   April Palumbo, MD   BP 117/72 mmHg  Pulse 59  Temp(Src) 97.8 F (36.6 C) (Oral)  Resp 15  SpO2 100%   Physical Exam  Constitutional: He appears well-developed and well-nourished. No distress.  HENT:  Head: Normocephalic and atraumatic.  Right Ear: External ear normal.  Left Ear: External ear normal.  Eyes: Conjunctivae are normal. Right eye exhibits no discharge. Left eye exhibits no discharge. No scleral icterus.  Neck: Neck supple. No tracheal deviation present.  Cardiovascular: Normal rate, regular rhythm and intact distal pulses.   Pulmonary/Chest: Effort normal and breath sounds normal. No stridor. No respiratory distress. He has no wheezes. He has no rales.  Abdominal: Soft. Bowel sounds are normal. He exhibits no distension. There is no tenderness. There is no rebound and no guarding.  Musculoskeletal: He exhibits no edema or tenderness.  Neurological: He is alert. He has normal strength. No cranial nerve deficit (no facial  droop, extraocular movements intact, no slurred speech) or sensory deficit. He exhibits normal muscle tone. He displays no seizure activity. Coordination normal.  Skin: Skin is warm and dry. No rash noted.  Psychiatric: He has a normal mood and affect.  Nursing note and vitals reviewed.   ED Course  Procedures   DIAGNOSTIC STUDIES: Oxygen Saturation is 100% on room air, normal by my interpretation.    COORDINATION OF CARE: 12:26 AM Will give fluids, benadryl, compazine and pain medication. Discussed treatment plan with pt at bedside and pt agreed to plan.  Labs Review Labs Reviewed  COMPREHENSIVE METABOLIC PANEL - Abnormal; Notable for the following:    Glucose, Bld 101 (*)    BUN 23 (*)    ALT 16 (*)    All other components within normal limits  CBC - Abnormal; Notable for the following:    RBC 3.89 (*)    Hemoglobin 11.6 (*)    HCT 34.3 (*)    All other components within normal limits  LIPASE, BLOOD  URINALYSIS, ROUTINE W REFLEX MICROSCOPIC (NOT AT [facility])   Imaging Review No results found. I have personally reviewed and evaluated these images and lab results as part of my medical decision-making.   EKG Interpretation None      MDM   Final diagnoses:  Nonintractable headache, unspecified chronicity pattern, unspecified headache type  Abdominal cramps    I personally preformed the services scribed in my presence. The recorded information has been reviewed is accurate. Raeford RazorStephen Kimberle Stanfill, MD.    Raeford RazorStephen Jeanett Antonopoulos, MD 09/22/15 [phone]

## 2015-09-17 NOTE — ED Notes (Signed)
Informed the pt that a urine specimen is needed. 

## 2016-09-13 ENCOUNTER — Encounter (HOSPITAL_COMMUNITY): Payer: Self-pay | Admitting: Emergency Medicine

## 2016-09-13 ENCOUNTER — Emergency Department (HOSPITAL_COMMUNITY)
Admission: EM | Admit: 2016-09-13 | Discharge: 2016-09-13 | Disposition: A | Discharge status: Home / Self Care | Payer: Self-pay | Attending: Emergency Medicine | Admitting: Emergency Medicine

## 2016-09-13 DIAGNOSIS — R21 Rash and other nonspecific skin eruption: Secondary | ICD-10-CM | POA: Insufficient documentation

## 2016-09-13 DIAGNOSIS — Z79899 Other long term (current) drug therapy: Secondary | ICD-10-CM | POA: Insufficient documentation

## 2016-09-13 DIAGNOSIS — J45909 Unspecified asthma, uncomplicated: Secondary | ICD-10-CM | POA: Insufficient documentation

## 2016-09-13 DIAGNOSIS — Z87891 Personal history of nicotine dependence: Secondary | ICD-10-CM | POA: Insufficient documentation

## 2016-09-13 NOTE — Discharge Instructions (Signed)
Use hydrocortisone cream as directed on rash. You can continue to take Benadryl 50 mg every 6 hours as needed for itch. Don't drive or operate heavy machinery while taking Benadryl as it can cause drowsiness. Call the number on these instructions to get a primary care physician and to be seen if not feeling better by next week. Or you can see an urgent care center. Ask your new primary care physician and ask you to stop smoking

## 2016-09-13 NOTE — ED Triage Notes (Signed)
Patient complains of itchy rash to bends of arm and bilateral groin x 3 days, fine rash with no drainage. Complains of itching, denies pain

## 2016-09-13 NOTE — ED Provider Notes (Signed)
[facility]-EMERGENCY DEPT Provider Note   CSN: [id number] Arrival date & time: 09/13/16  [id number]     History   Chief Complaint No chief complaint on file. Chief complaint rash  HPI Jorge E Criss AlvineJohnson Jr. is a 31 y.o. male.  HPI patient complains of rash which is pruritic and irritating on right groin and left antecubital fossa for the past 3-4 days. He reports that his wife told him that he had rash on his face as well, which has since resolved. He is treated himself with Benadryl with partial relief. He denies fever denies feeling ill denies shortness of breath. No other associated symptoms. Nothing makes symptoms better or worse  Past Medical History:  Diagnosis Date  . Asthma     There are no active problems to display for this patient.   History reviewed. No pertinent surgical history.     Home Medications    Prior to Admission medications   Medication Sig Start Date End Date Taking? Authorizing Provider  acetaminophen (TYLENOL) 500 MG tablet Take 1,000 mg by mouth every 6 (six) hours as needed (for pain.).    Historical Provider, MD  albuterol (PROVENTIL HFA;VENTOLIN HFA) 108 (90 BASE) MCG/ACT inhaler Inhale 1-2 puffs into the lungs every 6 (six) hours as needed for wheezing or shortness of breath. 05/10/15   April Palumbo, MD  albuterol (PROVENTIL) (2.5 MG/3ML) 0.083% nebulizer solution Take 2.5 mg by nebulization every 6 (six) hours as needed for wheezing or shortness of breath.    Historical Provider, MD  ibuprofen (ADVIL,MOTRIN) 800 MG tablet Take 1 tablet (800 mg total) by mouth 3 (three) times daily. Patient not taking: Reported on 05/09/2015 04/26/15   Shawn C Joy, PA-C  penicillin v potassium (VEETID) 500 MG tablet Take 1 tablet (500 mg total) by mouth 4 (four) times daily. Patient not taking: Reported on 07/07/2015 04/26/15   Hillard DankerShawn C Joy, PA-C  predniSONE (DELTASONE) 20 MG tablet 3 tabs po day one, then 2 po daily x 4 days Patient not taking: Reported on 07/07/2015 05/10/15    April Palumbo, MD    Family History No family history on file.  Social History Social History  Substance Use Topics  . Smoking status: Former Smoker    Types: Cigars  . Smokeless tobacco: Not on file  . Alcohol use Yes     Comment: occ   Positive smoker positive marijuana use no other illicit drug use  Allergies   Food   Review of Systems Review of Systems  Constitutional: Negative.   HENT: Negative.   Respiratory: Negative.   Skin: Positive for rash.  Allergic/Immunologic: Negative.   Psychiatric/Behavioral: Negative.      Physical Exam Updated Vital Signs BP 126/78 (BP Location: Right Arm)   Pulse (!) 56   Temp 98 F (36.7 C) (Oral)   Resp 18   SpO2 100%   Physical Exam  Constitutional: He is oriented to person, place, and time. He appears well-developed and well-nourished. No distress.  HENT:  Head: Normocephalic and atraumatic.  Right Ear: External ear normal.  Left Ear: External ear normal.  Nose: Nose normal.  No mucosal lesion  Neck: Neck supple.  Cardiovascular:  Mildly bradycardic  Pulmonary/Chest: Effort normal and breath sounds normal.  Abdominal: Soft. He exhibits no distension. There is no tenderness.  Genitourinary: Penis normal.  Lymphadenopathy:    He has no cervical adenopathy.  Neurological: He is oriented to person, place, and time.  Skin: Skin is warm and dry. Capillary refill takes  less than 2 seconds. Rash noted.  Minimally reddened at left antecubital and right groin. No other lesion. Reddened areas are not hot, tender or swollen. No lesions on palms or soles. No other rash  Psychiatric: He has a normal mood and affect.     ED Treatments / Results  Labs (all labs ordered are listed, but only abnormal results are displayed) Labs Reviewed - No data to display  EKG  EKG Interpretation None       Radiology No results found.  Procedures Procedures (including critical care time)  Medications Ordered in  ED Medications - No data to display   Initial Impression / Assessment and Plan / ED Course  I have reviewed the triage vital signs and the nursing notes.  Pertinent labs & imaging results that were available during my care of the patient were reviewed by me and considered in my medical decision making (see chart for details).     Patient not ill-appearing. Rash felt to be nonspecific. Suggest hydrocortisone cream in addition to Benadryl. I counseled patient for 5 minutes on smoking cessation as he has asthma and continues to smoke and use marijuana. He is referred to primary care.  Final Clinical Impressions(s) / ED Diagnoses   Final diagnoses:  Rash  Dx #1 nonspecific rash #2 tobacco abuse  New Prescriptions New Prescriptions   No medications on file     Doug Sou, MD 09/13/16 [phone]

## 2017-07-08 ENCOUNTER — Emergency Department (HOSPITAL_COMMUNITY)
Admission: EM | Admit: 2017-07-08 | Discharge: 2017-07-08 | Disposition: A | Discharge status: Home / Self Care | Payer: Self-pay | Attending: Emergency Medicine | Admitting: Emergency Medicine

## 2017-07-08 ENCOUNTER — Emergency Department (HOSPITAL_COMMUNITY): Payer: Self-pay

## 2017-07-08 ENCOUNTER — Encounter (HOSPITAL_COMMUNITY): Payer: Self-pay

## 2017-07-08 DIAGNOSIS — J45901 Unspecified asthma with (acute) exacerbation: Secondary | ICD-10-CM | POA: Insufficient documentation

## 2017-07-08 DIAGNOSIS — Z87891 Personal history of nicotine dependence: Secondary | ICD-10-CM | POA: Insufficient documentation

## 2017-07-08 LAB — CBC
HCT: 37.5 % — ABNORMAL LOW (ref 39.0–52.0)
HEMOGLOBIN: 12.5 g/dL — AB (ref 13.0–17.0)
MCH: 29 pg (ref 26.0–34.0)
MCHC: 33.3 g/dL (ref 30.0–36.0)
MCV: 87 fL (ref 78.0–100.0)
Platelets: 237 10*3/uL (ref 150–400)
RBC: 4.31 MIL/uL (ref 4.22–5.81)
RDW: 13.6 % (ref 11.5–15.5)
WBC: 6.4 10*3/uL (ref 4.0–10.5)

## 2017-07-08 LAB — BASIC METABOLIC PANEL
Anion gap: 6 (ref 5–15)
BUN: 9 mg/dL (ref 6–20)
CALCIUM: 9.4 mg/dL (ref 8.9–10.3)
CO2: 24 mmol/L (ref 22–32)
Chloride: 105 mmol/L (ref 101–111)
Creatinine, Ser: 0.86 mg/dL (ref 0.61–1.24)
GFR calc Af Amer: >60 mL/min (ref >60)
GFR calc non Af Amer: >60 mL/min (ref >60)
GLUCOSE: 108 mg/dL — AB (ref 65–99)
Potassium: 3.7 mmol/L (ref 3.5–5.1)
Sodium: 135 mmol/L (ref 135–145)

## 2017-07-08 LAB — I-STAT TROPONIN, ED
TROPONIN I, POC: 0.01 ng/mL (ref 0.00–0.08)
Troponin i, poc: 0 ng/mL (ref 0.00–0.08)

## 2017-07-08 MED ORDER — ALBUTEROL SULFATE HFA 108 (90 BASE) MCG/ACT IN AERS
2.0000 | INHALATION_SPRAY | Freq: Once | RESPIRATORY_TRACT | Status: AC
  Administered 2017-07-08: 2 via RESPIRATORY_TRACT
  Filled 2017-07-08: qty 6.7

## 2017-07-08 MED ORDER — ALBUTEROL SULFATE HFA 108 (90 BASE) MCG/ACT IN AERS: 2.0000 | INHALATION_SPRAY | RESPIRATORY_TRACT | 1 refills | Status: DC | PRN

## 2017-07-08 MED ORDER — ALBUTEROL SULFATE (2.5 MG/3ML) 0.083% IN NEBU
5.0000 mg | INHALATION_SOLUTION | Freq: Once | RESPIRATORY_TRACT | Status: AC
  Administered 2017-07-08: 5 mg via RESPIRATORY_TRACT
  Filled 2017-07-08: qty 6

## 2017-07-08 MED ORDER — PREDNISONE 10 MG (21) PO TBPK: ORAL_TABLET | ORAL | 0 refills | Status: DC

## 2017-07-08 MED ORDER — METHYLPREDNISOLONE SODIUM SUCC 125 MG IJ SOLR
125.0000 mg | Freq: Once | INTRAMUSCULAR | Status: AC
  Administered 2017-07-08: 125 mg via INTRAVENOUS
  Filled 2017-07-08: qty 2

## 2017-07-08 MED ORDER — IPRATROPIUM-ALBUTEROL 0.5-2.5 (3) MG/3ML IN SOLN
3.0000 mL | Freq: Once | RESPIRATORY_TRACT | Status: AC
  Administered 2017-07-08: 3 mL via RESPIRATORY_TRACT
  Filled 2017-07-08: qty 3

## 2017-07-08 NOTE — ED Provider Notes (Signed)
[facility] Provider Note   CSN: [id number] Arrival date & time: 07/08/17  1528     History   Chief Complaint Chief Complaint  Patient presents with  . Shortness of Breath    HPI Jorge E Criss AlvineJohnson Jr. is a 32 y.o. male.  HPI   Jorge E Criss AlvineJohnson Jr. is a 32 y.o. male, with a history of asthma, presenting to the ED with shortness of breath beginning yesterday. Preceded by nasal congestion and cough.  States he does not have an albuterol inhaler and has been using his daughter's nebulizer.  He is only had to use it once today.  It did improve his symptoms.  Denies fever, chest pain, N/V/D, or any other complaints.   Past Medical History:  Diagnosis Date  . Asthma     There are no active problems to display for this patient.   History reviewed. No pertinent surgical history.     Home Medications    Prior to Admission medications   Medication Sig Start Date End Date Taking? Authorizing Provider  acetaminophen (TYLENOL) 500 MG tablet Take 1,000 mg by mouth every 6 (six) hours as needed (for pain).    Yes [provider]  albuterol (PROVENTIL HFA;VENTOLIN HFA) 108 (90 BASE) MCG/ACT inhaler Inhale 1-2 puffs into the lungs every 6 (six) hours as needed for wheezing or shortness of breath. 05/10/15  Yes Palumbo, April, MD  Phenylephrine-APAP-Guaifenesin [facility](MUCINEX FAST-MAX) [phone] MG/20ML LIQD Take 10-20 mLs by mouth every 6 (six) hours as needed (for congestion).   Yes [provider]  albuterol (PROVENTIL HFA;VENTOLIN HFA) 108 (90 Base) MCG/ACT inhaler Inhale 2 puffs into the lungs every 4 (four) hours as needed for wheezing or shortness of breath. 07/08/17   Hollis Oh C, PA-C  ibuprofen (ADVIL,MOTRIN) 800 MG tablet Take 1 tablet (800 mg total) by mouth 3 (three) times daily. Patient not taking: Reported on 07/08/2017 04/26/15   Harolyn RutherfordJoy, Shaelin Lalley C, PA-C  penicillin v potassium (VEETID) 500 MG tablet Take 1 tablet (500 mg total) by  mouth 4 (four) times daily. Patient not taking: Reported on 07/08/2017 04/26/15   Daquawn Seelman, Hillard DankerShawn C, PA-C  predniSONE (STERAPRED UNI-PAK 21 TAB) 10 MG (21) TBPK tablet Take 6 tabs (60mg ) on day 1, 5 tabs (50mg ) on day 2, 4 tabs (40mg ) on day 3, 3 tabs (30mg ) on day 4, 2 tabs (20mg ) on day 5, and 1 tab (10mg ) on day 6. 07/08/17   Nayan Proch, Hillard DankerShawn C, PA-C    Family History No family history on file.  Social History Social History   Tobacco Use  . Smoking status: Former Smoker    Types: Cigars  Substance Use Topics  . Alcohol use: Yes    Comment: occ  . Drug use: Yes    Frequency: 7.0 times per week    Types: Marijuana     Allergies   Apple   Review of Systems Review of Systems  Constitutional: Negative for fever.  HENT: Positive for congestion.   Respiratory: Positive for cough and shortness of breath.   Cardiovascular: Negative for chest pain and leg swelling.  Gastrointestinal: Negative for abdominal pain, diarrhea, nausea and vomiting.  All other systems reviewed and are negative.    Physical Exam Updated Vital Signs BP 136/82 (BP Location: Right Arm)   Pulse 82   Temp 99.5 F (37.5 C) (Oral)   Resp 16   Ht 6\' 1"  (1.854 m)   Wt 63.5 kg (140 lb)   SpO2  94%   BMI 18.47 kg/m   Physical Exam  Constitutional: He appears well-developed and well-nourished. No distress.  HENT:  Head: Normocephalic and atraumatic.  Eyes: Conjunctivae are normal.  Neck: Neck supple.  Cardiovascular: Normal rate, regular rhythm, normal heart sounds and intact distal pulses.  Pulmonary/Chest: He has wheezes.  Patient speaks in full sentences.  No noted increased work of breathing. Global inspiratory and expiratory wheezing.  Abdominal: Soft. There is no tenderness. There is no guarding.  Musculoskeletal: He exhibits no edema.  Lymphadenopathy:    He has no cervical adenopathy.  Neurological: He is alert.  Skin: Skin is warm and dry. Capillary refill takes less than 2 seconds. He is not  diaphoretic.  Psychiatric: He has a normal mood and affect. His behavior is normal.  Nursing note and vitals reviewed.    ED Treatments / Results  Labs (all labs ordered are listed, but only abnormal results are displayed) Labs Reviewed  BASIC METABOLIC PANEL - Abnormal; Notable for the following components:      Result Value   Glucose, Bld 108 (*)    All other components within normal limits  CBC - Abnormal; Notable for the following components:   Hemoglobin 12.5 (*)    HCT 37.5 (*)    All other components within normal limits  I-STAT TROPONIN, ED  I-STAT TROPONIN, ED    EKG  EKG Interpretation  Date/Time:  Wednesday July 08 2017 16:20:24 EST Ventricular Rate:  85 PR Interval:  158 QRS Duration: 104 QT Interval:  344 QTC Calculation: 409 R Axis:   93 Text Interpretation:  Normal sinus rhythm Rightward axis ST elevation consider anterior injury or acute infarct Abnormal ECG Confirmed by Tilden Fossa [phone]) on 07/08/2017 7:02:59 PM       EKG Interpretation  Date/Time:  Wednesday July 08 2017 20:50:56 EST Ventricular Rate:  85 PR Interval:  158 QRS Duration: 98 QT Interval:  359 QTC Calculation: 427 R Axis:   98 Text Interpretation:  Sinus rhythm Probable inferior infarct, old Borderline ST elevation, anterior leads Confirmed by Tilden Fossa [phone]) on 07/08/2017 9:45:33 PM       Radiology Dg Chest 2 View  Result Date: 07/08/2017 CLINICAL DATA:  Shortness of breath.  History of asthma. EXAM: CHEST  2 VIEW COMPARISON:  05/09/2015 FINDINGS: The cardiomediastinal silhouette is within normal limits. The lungs are well inflated with possible mild chronic peribronchial thickening. No confluent airspace opacity, edema, pleural effusion, or pneumothorax is identified. No acute osseous abnormality is seen. IMPRESSION: No evidence of active cardiopulmonary disease. Electronically Signed   By: Sebastian Ache M.D.   On: 07/08/2017 16:49    Procedures Procedures  (including critical care time)  Medications Ordered in ED Medications  albuterol (PROVENTIL) (2.5 MG/3ML) 0.083% nebulizer solution 5 mg (5 mg Nebulization Given 07/08/17 1610)  ipratropium-albuterol (DUONEB) 0.5-2.5 (3) MG/3ML nebulizer solution 3 mL (3 mLs Nebulization Given 07/08/17 1947)  methylPREDNISolone sodium succinate (SOLU-MEDROL) 125 mg/2 mL injection 125 mg (125 mg Intravenous Given 07/08/17 1947)  albuterol (PROVENTIL HFA;VENTOLIN HFA) 108 (90 Base) MCG/ACT inhaler 2 puff (2 puffs Inhalation Given 07/08/17 1947)     Initial Impression / Assessment and Plan / ED Course  I have reviewed the triage vital signs and the nursing notes.  Pertinent labs & imaging results that were available during my care of the patient were reviewed by me and considered in my medical decision making (see chart for details).  Clinical Course as of Jul 09 2147  Wed  Jul 08, 2017  2024 Patient voices improvement in his breathing. Air movement has improved. Minor global wheezing remains, but has greatly improved. Patient states he is ready for discharge. Appears relaxed. Respiratory rate 18 bpm.  [SJ]    Clinical Course User Index [SJ] Yuto Cajuste C, PA-C    Patient presents with shortness of breath in the setting of asthma history.  Patient improved significantly following albuterol and DuoNeb treatments.  Patient was given an albuterol inhaler for him to take home.  Recommend PCP follow-up for his asthma.  Resources given.  Patient also had EKG abnormalities.  Delta troponins negative.  No noted EKG changes during ED course.  Cardiology follow-up recommended.  The patient was given instructions for home care as well as return precautions. Patient voices understanding of these instructions, accepts the plan, and is comfortable with discharge.     Vitals:   07/08/17 1606 07/08/17 2000 07/08/17 2030 07/08/17 2115  BP:  (!) 141/90 (!) 147/83 (!) 132/91  Pulse:  87 92 96  Resp:  (!) 23 17 13   Temp:        TempSrc:      SpO2:  98% 96% 96%  Weight: 63.5 kg (140 lb)     Height: 6\' 1"  (1.854 m)        Final Clinical Impressions(s) / ED Diagnoses   Final diagnoses:  Exacerbation of asthma, unspecified asthma severity, unspecified whether persistent    ED Discharge Orders        Ordered    albuterol (PROVENTIL HFA;VENTOLIN HFA) 108 (90 Base) MCG/ACT inhaler  Every 4 hours PRN     07/08/17 1956    predniSONE (STERAPRED UNI-PAK 21 TAB) 10 MG (21) TBPK tablet     07/08/17 1956       Anselm Pancoast, PA-C 07/08/17 2149    Tilden Fossa, MD 07/09/17 1559

## 2017-07-08 NOTE — ED Notes (Signed)
ED Provider at bedside. 

## 2017-07-08 NOTE — Discharge Instructions (Addendum)
Your symptoms are likely due to an asthma exacerbation, however, the cause may be due to weather changes or a virus.  Viruses do not require antibiotics. Treatment is symptomatic care and it is important to note that these symptoms may last for 7-14 days.   Hand washing: Wash your hands throughout the day, but especially before and after touching the face, using the restroom, sneezing, coughing, or touching surfaces that have been coughed or sneezed upon. Hydration: Symptoms will be intensified and complicated by dehydration. Dehydration can also extend the duration of symptoms. Drink plenty of fluids and get plenty of rest. You should be drinking at least half a liter of water an hour to stay hydrated. Electrolyte drinks (ex. Gatorade, Powerade, Pedialyte) are also encouraged. You should be drinking enough fluids to make your urine light yellow, almost clear. If this is not the case, you are not drinking enough water. Please note that some of the treatments indicated below will not be effective if you are not adequately hydrated. Diet: Please concentrate on hydration, however, you may introduce food slowly.  Start with a clear liquid diet, progressed to a full liquid diet, and then bland solids as you are able. Pain or fever: Ibuprofen, Naproxen, or Tylenol for pain or fever.  Albuterol: May use the albuterol as needed for instances of shortness of breath. Prednisone: Take the prednisone, as directed, in its entirety. Zyrtec or Claritin: May add these medication daily to control underlying symptoms of congestion, sneezing, and other signs of allergies. Flonase: Use this medication, as directed, for nasal and sinus congestion. Congestion: Plain Mucinex may help relieve congestion. Saline sinus rinses and saline nasal sprays may also help relieve congestion. If you do not have heart problems or an allergy to such medications, you may also try phenylephrine or Sudafed. Sore throat: Warm liquids or  Chloraseptic spray may help soothe a sore throat. Gargle twice a day with a salt water solution made from a half teaspoon of salt in a cup of warm water.  Follow up: Follow up with a primary care provider as soon as possible for continued management of your asthma.  Cardiology follow-up: You were also noted to have some abnormalities on your EKG. You should follow up with the cardiologist on this matter as soon as possible. Please call the number provided to set up an appointment.

## 2017-07-08 NOTE — ED Triage Notes (Signed)
Pt c.o shortness of breath that started last night, pt states it was worse when he woke up. Hx of asthma and has been using neb treatments at home without relief. Pt also c.o dizziness since this morning. Also having chills. Pt a.o, resp e/u, able to talk in complete sentences.

## 2017-07-22 ENCOUNTER — Emergency Department (HOSPITAL_COMMUNITY)
Admission: EM | Admit: 2017-07-22 | Discharge: 2017-07-23 | Discharge status: Against Medical Advice | Payer: Self-pay | Attending: Emergency Medicine | Admitting: Emergency Medicine

## 2017-07-22 ENCOUNTER — Encounter (HOSPITAL_COMMUNITY): Payer: Self-pay | Admitting: Emergency Medicine

## 2017-07-22 DIAGNOSIS — Z5321 Procedure and treatment not carried out due to patient leaving prior to being seen by health care provider: Secondary | ICD-10-CM | POA: Insufficient documentation

## 2017-07-22 NOTE — ED Notes (Signed)
No answer when called for roll call 

## 2017-07-22 NOTE — ED Triage Notes (Signed)
Patient reports that he was given [facility] couple [facility]. Reports over past couple days since he finished prednisone he been having intermittent headaches and left side neck pain and sore throat.

## 2017-07-24 DIAGNOSIS — R0602 Shortness of breath: Secondary | ICD-10-CM | POA: Insufficient documentation

## 2017-07-28 ENCOUNTER — Ambulatory Visit: Payer: Self-pay | Admitting: Physician Assistant

## 2017-08-03 ENCOUNTER — Encounter: Payer: Self-pay | Admitting: Nurse Practitioner

## 2017-08-03 ENCOUNTER — Ambulatory Visit (INDEPENDENT_AMBULATORY_CARE_PROVIDER_SITE_OTHER): Payer: Self-pay | Admitting: Nurse Practitioner

## 2017-08-03 VITALS — BP 110/60 | HR 70 | Ht 73.0 in | Wt 142.1 lb

## 2017-08-03 DIAGNOSIS — R9431 Abnormal electrocardiogram [ECG] [EKG]: Secondary | ICD-10-CM

## 2017-08-03 DIAGNOSIS — R0602 Shortness of breath: Secondary | ICD-10-CM

## 2017-08-03 NOTE — Patient Instructions (Addendum)
We will be checking the following labs today - NONE   Medication Instructions:    Continue with your current medicines.     Testing/Procedures To Be Arranged:  N/A  Follow-Up:   See back as needed lets see what echo shows.    Other Special Instructions:   N/A    If you need a refill on your cardiac medications before your next appointment, please call your pharmacy.   Call the [facility] office at [phone] if you have any questions, problems or concerns.

## 2017-08-03 NOTE — Progress Notes (Signed)
CARDIOLOGY OFFICE NOTE  Date:  08/03/2017    Jorge Glancearryl E Larock Jr. Date of Birth: Mar 30, 1986 Medical Record #[id number]  PCP:  Patient, No Pcp Per  Cardiologist:  Anne FuSkains (NEW)    Chief Complaint  Patient presents with  . Shortness of Breath  . Abnormal ECG    New patient visit - seen for Dr. Anne FuSkains (NEW)    History of Present Illness: Jorge E Criss AlvineJohnson Jr. is a 32 y.o. male who presents today for a new patient visit. Seen for Dr. Anne FuSkains.   He has no cardiac history. He has had a history of asthma.   Comes in today. Here alone. In the ER earlier this month with shortness of breath - EKG abnormal - subsequently sent here. Tells me that he woke up that morning and could not breath - gasping for air - somewhat similar to his asthma but had not really had too much issue with his asthma. Wife convinced him to go to the ER. Treated with breathing treatments and steroids/inhalers. Notes heart is fluttering when he was short of breath and has had some chest pain. For the most part he says he now feels good and feels like he is back to his normal basis over the past 2 weeks. His chest pain is sharp - has had for years - he tries to "disregard" and does not seem too concerned. He is pretty active. 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 He does smoke marijuana. Social alcohol use. Parents are alive - mom has HTN and father ok - they do not have MI/stroke/known CAD. He says he has been chronically underweight.   Past Medical History:  Diagnosis Date  . Asthma 1994  . Bronchitis 1997  . Pneumonia 2013    History reviewed. No pertinent surgical history.   Medications: Current Meds  Medication Sig  . acetaminophen (TYLENOL) 500 MG tablet Take 1,000 mg by mouth every 6 (six) hours as needed (for pain).   Marland Kitchen. albuterol (PROVENTIL HFA;VENTOLIN HFA) 108 (90 Base) MCG/ACT inhaler Inhale 2 puffs into the lungs every 4 (four) hours as needed for wheezing or  shortness of breath.  Marland Kitchen. amoxicillin (AMOXIL) 875 MG tablet Take 875 mg by mouth 2 (two) times daily.  Marland Kitchen. ibuprofen (ADVIL,MOTRIN) 800 MG tablet Take 1 tablet (800 mg total) by mouth 3 (three) times daily.     Allergies: Allergies  Allergen Reactions  . Apple Itching    Apples make the gums itch    Social History: The patient  reports that he has quit smoking. His smoking use included cigars. he has never used smokeless tobacco. He reports that he drinks alcohol. He reports that he uses drugs. Drug: Marijuana. Frequency: 7.00 times per week.   Family History: The patient's family history includes Asthma in his paternal grandmother; COPD in his paternal grandmother; High blood pressure in his mother; Stroke in his maternal grandmother.   Review of Systems: Please see the history of present illness.   Otherwise, the review of systems is positive for none.   All other systems are reviewed and negative.   Physical Exam: VS:  BP 110/60 (BP Location: Left Arm, Patient Position: Sitting, Cuff Size: Normal)   Pulse 70   Ht 6\' 1"  (1.854 m)   Wt 142 lb 1.9 oz (64.5 kg)   SpO2 100% Comment: at rest  BMI 18.75 kg/m  .  BMI Body mass index is 18.75 kg/m.  Wt Readings from Last 3 Encounters:  08/03/17 142 lb 1.9 oz (64.5 kg)  07/08/17 140 lb (63.5 kg)  07/07/15 140 lb (63.5 kg)    General: Pleasant. He is quite lanky - tall and very skinny. Alert and in no acute distress.  HEENT: Normal.  Neck: Supple, no JVD, carotid bruits, or masses noted.  Cardiac: Regular rate and rhythm. No murmurs, rubs, or gallops. No edema.  Respiratory:  Lungs are clear to auscultation bilaterally with normal work of breathing.  GI: Soft and nontender.  MS: No deformity or atrophy. Gait and ROM intact.  Skin: Warm and dry. Color is normal.  Neuro:  Strength and sensation are intact and no gross focal deficits noted.  Psych: Alert, appropriate and with normal affect.   LABORATORY DATA:  EKG:  EKG is  ordered today. This demonstrates NSR with inferior Q's - reviewed with Dr. Anne Fu. Noted on EKG from 2015 as well. EKG from the ER visit from earlier this month reviewed with Dr. Anne Fu as well also.   Lab Results  Component Value Date   WBC 6.4 07/08/2017   HGB 12.5 (L) 07/08/2017   HCT 37.5 (L) 07/08/2017   PLT 237 07/08/2017   GLUCOSE 108 (H) 07/08/2017   ALT 16 (L) 09/16/2015   AST 19 09/16/2015   NA 135 07/08/2017   K 3.7 07/08/2017   CL 105 07/08/2017   CREATININE 0.86 07/08/2017   BUN 9 07/08/2017   CO2 24 07/08/2017     BNP (last 3 results) No results for input(s): BNP in the last 8760 hours.  ProBNP (last 3 results) No results for input(s): PROBNP in the last 8760 hours.   Other Studies Reviewed Today:   Assessment/Plan:  1. Abnormal EKG - chronic - noted on prior tracings back to 2015 - discussed with DR. Skains and will check echocardiogram - suspect some of this may be due to his body habitus. Further disposition pending.   2. Recent episode of shortness of breath - treated for asthma - symptoms have now resolved.   3. Atypical chest pain - chronic - only risk factor is smoking - not felt to be cardiac. Will see what the echo shows.   Current medicines are reviewed with the patient today.  The patient does not have concerns regarding medicines other than what has been noted above.  The following changes have been made:  See above.  Labs/ tests ordered today include:    Orders Placed This Encounter  Procedures  . EKG 12-Lead  . ECHOCARDIOGRAM COMPLETE     Disposition:   Further disposition pending.   Patient is agreeable to this plan and will call if any problems develop in the interim.   SignedNorma Fredrickson, NP  08/03/2017 12:12 PM  [facility] [address] [place], [place]  [address] Phone: [phone] Fax: [phone]

## 2017-08-12 ENCOUNTER — Ambulatory Visit: Payer: Self-pay | Admitting: Nurse Practitioner

## 2017-08-14 ENCOUNTER — Other Ambulatory Visit: Payer: Self-pay

## 2018-03-19 ENCOUNTER — Ambulatory Visit (HOSPITAL_COMMUNITY)
Admission: EM | Admit: 2018-03-19 | Discharge: 2018-03-19 | Disposition: A | Discharge status: Home / Self Care | Payer: Managed Care, Other (non HMO) | Attending: Family Medicine | Admitting: Family Medicine

## 2018-03-19 ENCOUNTER — Encounter (HOSPITAL_COMMUNITY): Payer: Self-pay

## 2018-03-19 DIAGNOSIS — R0602 Shortness of breath: Secondary | ICD-10-CM | POA: Diagnosis not present

## 2018-03-19 DIAGNOSIS — R42 Dizziness and giddiness: Secondary | ICD-10-CM

## 2018-03-19 MED ORDER — PREDNISONE 50 MG PO TABS: 50.0000 mg | ORAL_TABLET | Freq: Every day | ORAL | 0 refills | Status: DC

## 2018-03-19 NOTE — ED Triage Notes (Signed)
Pt presents with shortness of breath and dizziness along with headaches.

## 2018-03-19 NOTE — ED Provider Notes (Signed)
[facility]    CSN: [id number] Arrival date & time: 03/19/18  1911     History   Chief Complaint Chief Complaint  Patient presents with  . Shortness of Breath  . Dizziness    HPI Jorge E Criss AlvineJohnson Jr. is a 32 y.o. male.   32 year old male comes in for 1 week history of shortness of breath.  States has also felt wheezing, and shortness of breath with wheezing resolved with albuterol.  However, while working yesterday, felt increased shortness of breath that was not completely resolved by albuterol.  For the past few days, also had dizziness, mainly with positional change.  He denies chest pain, palpitation, lightheadedness, syncope.  Denies fever, chills, night sweats.  Denies URI symptoms such as cough, congestion, sore throat.  States work does require heavy lifting, and is in a hot stuffy room, which sometimes exacerbates his asthma.  He was referred to cardiology earlier this year due to abnormal EKG finding, at that time an echo was suggested.  States he was unable to get echo due to lack of insurance.      Past Medical History:  Diagnosis Date  . Asthma 1994  . Bronchitis 1997  . Pneumonia 2013    Patient Active Problem List   Diagnosis Date Noted  . SOB (shortness of breath) 07/24/2017    History reviewed. No pertinent surgical history.     Home Medications    Prior to Admission medications   Medication Sig Start Date End Date Taking? Authorizing Provider  acetaminophen (TYLENOL) 500 MG tablet Take 1,000 mg by mouth every 6 (six) hours as needed (for pain).     [provider]  albuterol (PROVENTIL HFA;VENTOLIN HFA) 108 (90 Base) MCG/ACT inhaler Inhale 2 puffs into the lungs every 4 (four) hours as needed for wheezing or shortness of breath. 07/08/17   Joy, Shawn C, PA-C  amoxicillin (AMOXIL) 875 MG tablet Take 875 mg by mouth 2 (two) times daily.    [provider]  ibuprofen (ADVIL,MOTRIN) 800 MG tablet Take 1 tablet (800 mg total) by  mouth 3 (three) times daily. 04/26/15   Joy, Shawn C, PA-C  predniSONE (DELTASONE) 50 MG tablet Take 1 tablet (50 mg total) by mouth daily. 03/19/18   Belinda FisherYu, Titiana Severa V, PA-C    Family History Family History  Problem Relation Age of Onset  . High blood pressure Mother   . Stroke Maternal Grandmother   . COPD Paternal Grandmother   . Asthma Paternal Grandmother     Social History Social History   Tobacco Use  . Smoking status: Former Smoker    Types: Cigars  . Smokeless tobacco: Never Used  Substance Use Topics  . Alcohol use: Yes    Comment: occ  . Drug use: Yes    Frequency: 7.0 times per week    Types: Marijuana     Allergies   Apple   Review of Systems Review of Systems  Reason unable to perform ROS: See HPI as above.     Physical Exam Triage Vital Signs ED Triage Vitals [03/19/18 1930]  Enc Vitals Group     BP 123/61     Pulse Rate 70     Resp 18     Temp 98.6 F (37 C)     Temp Source Oral     SpO2 99 %     Weight      Height      Head Circumference  Peak Flow      Pain Score      Pain Loc      Pain Edu?      Excl. in GC?    Orthostatic VS for the past 24 hrs:  BP- Lying Pulse- Lying BP- Sitting Pulse- Sitting BP- Standing at 0 minutes Pulse- Standing at 0 minutes  03/19/18 2001 104/52 62 110/62 64 110/61 70    Updated Vital Signs BP 123/61 (BP Location: Left Arm)   Pulse 70   Temp 98.6 F (37 C) (Oral)   Resp 18   SpO2 99%    Physical Exam  Constitutional: He is oriented to person, place, and time. He appears well-developed and well-nourished. No distress.  HENT:  Head: Normocephalic and atraumatic.  Right Ear: Tympanic membrane, external ear and ear canal normal. Tympanic membrane is not erythematous and not bulging.  Left Ear: Tympanic membrane, external ear and ear canal normal. Tympanic membrane is not erythematous and not bulging.  Nose: Nose normal. Right sinus exhibits no maxillary sinus tenderness and no frontal sinus tenderness.  Left sinus exhibits no maxillary sinus tenderness and no frontal sinus tenderness.  Mouth/Throat: Uvula is midline, oropharynx is clear and moist and mucous membranes are normal.  Eyes: Pupils are equal, round, and reactive to light. Conjunctivae are normal.  Neck: Normal range of motion. Neck supple.  Cardiovascular: Normal rate, regular rhythm and normal heart sounds. Exam reveals no gallop and no friction rub.  No murmur heard. Pulmonary/Chest: Effort normal and breath sounds normal. No accessory muscle usage. No respiratory distress. He has no decreased breath sounds. He has no wheezes. He has no rhonchi. He has no rales.  Lymphadenopathy:    He has no cervical adenopathy.  Neurological: He is alert and oriented to person, place, and time. He has normal strength. He is not disoriented. No cranial nerve deficit or sensory deficit. He displays a negative Romberg sign. Coordination and gait normal. GCS eye subscore is 4. GCS verbal subscore is 5. GCS motor subscore is 6.  Normal finger to nose, rapid movement.   Skin: Skin is warm and dry.  Psychiatric: He has a normal mood and affect. His behavior is normal. Judgment normal.     UC Treatments / Results  Labs (all labs ordered are listed, but only abnormal results are displayed) Labs Reviewed - No data to display  EKG None  Radiology No results found.  Procedures Procedures (including critical care time)  Medications Ordered in UC Medications - No data to display  Initial Impression / Assessment and Plan / UC Course  I have reviewed the triage vital signs and the nursing notes.  Pertinent labs & imaging results that were available during my care of the patient were reviewed by me and considered in my medical decision making (see chart for details).    Exam reassuring.  Patient without shortness of breath at this moment.  Will provide prednisone if symptoms still not improving with albuterol inhaler.  Push fluids.  Patient to  follow-up with cardiology for echo needed.  Return precautions given.  Patient expresses understanding and agrees to plan.  Final Clinical Impressions(s) / UC Diagnoses   Final diagnoses:  Shortness of breath  Dizziness    ED Prescriptions    Medication Sig Dispense Auth. Provider   predniSONE (DELTASONE) 50 MG tablet Take 1 tablet (50 mg total) by mouth daily. 5 tablet Threasa Alpha, PA-C 03/19/18  2036  

## 2018-03-19 NOTE — Discharge Instructions (Signed)
No alarming signs on exam.  Continue albuterol inhaler as needed for shortness of breath and wheezing.  Start prednisone as directed. Keep hydrated, your urine should be clear to pale yellow in color.  Please follow-up with cardiology for further evaluation as planned prior to this visit.  If experiencing worsening symptoms, chest pain, worsening dizziness, confusion, passing out, go to emergency department for further evaluation.

## 2018-06-19 ENCOUNTER — Encounter (HOSPITAL_COMMUNITY): Payer: Self-pay | Admitting: Emergency Medicine

## 2018-06-19 ENCOUNTER — Ambulatory Visit (HOSPITAL_COMMUNITY)
Admission: EM | Admit: 2018-06-19 | Discharge: 2018-06-19 | Disposition: A | Discharge status: Home / Self Care | Payer: Managed Care, Other (non HMO) | Attending: Family Medicine | Admitting: Family Medicine

## 2018-06-19 DIAGNOSIS — M546 Pain in thoracic spine: Secondary | ICD-10-CM

## 2018-06-19 DIAGNOSIS — M25522 Pain in left elbow: Secondary | ICD-10-CM | POA: Diagnosis not present

## 2018-06-19 MED ORDER — KETOROLAC TROMETHAMINE 30 MG/ML IJ SOLN
INTRAMUSCULAR | Status: AC
  Filled 2018-06-19: qty 1

## 2018-06-19 MED ORDER — KETOROLAC TROMETHAMINE 30 MG/ML IJ SOLN
30.0000 mg | Freq: Once | INTRAMUSCULAR | Status: AC
  Administered 2018-06-19: 30 mg via INTRAMUSCULAR
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
## 2018-07-28 ENCOUNTER — Ambulatory Visit (HOSPITAL_COMMUNITY)
Admission: EM | Admit: 2018-07-28 | Discharge: 2018-07-28 | Disposition: A | Discharge status: Home / Self Care | Payer: Managed Care, Other (non HMO) | Attending: Family Medicine | Admitting: Family Medicine

## 2018-07-28 ENCOUNTER — Encounter (HOSPITAL_COMMUNITY): Payer: Self-pay | Admitting: Emergency Medicine

## 2018-07-28 DIAGNOSIS — M62830 Muscle spasm of back: Secondary | ICD-10-CM

## 2018-07-28 DIAGNOSIS — G8929 Other chronic pain: Secondary | ICD-10-CM | POA: Diagnosis not present

## 2018-07-28 DIAGNOSIS — T148XXA Other injury of unspecified body region, initial encounter: Secondary | ICD-10-CM | POA: Diagnosis not present

## 2018-07-28 DIAGNOSIS — M545 Low back pain, unspecified: Secondary | ICD-10-CM

## 2018-07-28 MED ORDER — NAPROXEN 375 MG PO TABS: 375.0000 mg | ORAL_TABLET | Freq: Two times a day (BID) | ORAL | 0 refills | Status: DC

## 2018-07-28 MED ORDER — CYCLOBENZAPRINE HCL 10 MG PO TABS: 10.0000 mg | ORAL_TABLET | Freq: Every day | ORAL | 0 refills | Status: DC
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
## 2018-07-28 NOTE — Discharge Instructions (Signed)
Rest, ice and heat as needed Ensure adequate ROM as tolerated. Injuries all appear to be muscular in nature. Prescribed naproxen as needed for inflammation and pain relief Prescribed flexeril as needed at bedtime for muscle spasm.  Do not drive or operate heavy machinery while taking this medication Will f/u with his doctor or here if not seeing significant improvement within one week. Return here or go to ER if you have any new or worsening symptoms such as numbness/tingling of the inner thighs, loss of bladder or bowel control, headache/blurry vision, nausea/vomiting, confusion/altered mental status, dizziness, weakness, passing out, imbalance, etc..Marland Kitchen
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
## 2018-09-20 ENCOUNTER — Encounter: Payer: Self-pay | Admitting: Family Medicine

## 2018-09-20 ENCOUNTER — Ambulatory Visit
Admission: EM | Admit: 2018-09-20 | Discharge: 2018-09-20 | Disposition: A | Discharge status: Home / Self Care | Payer: Managed Care, Other (non HMO) | Attending: Family Medicine | Admitting: Family Medicine

## 2018-09-20 DIAGNOSIS — T7840XA Allergy, unspecified, initial encounter: Secondary | ICD-10-CM

## 2018-09-20 DIAGNOSIS — J4521 Mild intermittent asthma with (acute) exacerbation: Secondary | ICD-10-CM | POA: Diagnosis not present

## 2018-09-20 MED ORDER — ALBUTEROL SULFATE HFA 108 (90 BASE) MCG/ACT IN AERS: 2.0000 | INHALATION_SPRAY | RESPIRATORY_TRACT | 11 refills | Status: DC | PRN

## 2018-09-20 MED ORDER — PREDNISONE 20 MG PO TABS: ORAL_TABLET | ORAL | 0 refills | Status: DC

## 2018-09-20 NOTE — ED Triage Notes (Signed)
Pt c/o asthma/ allergies, headache and SOB x1wk

## 2018-09-20 NOTE — ED Provider Notes (Signed)
[facility]    CSN: [id number] Arrival date & time: 09/20/18  1846     History   Chief Complaint Chief Complaint  Patient presents with  . Asthma    HPI Jorge Williams. is a 33 y.o. male.   33 yo smoker with asthma and allergies who works nights at [employer].  He's had sinus congestion, worse after sleeping, for about a week with no fever or cough.  No increase in asthmatic sx. No shortness of breath No epistaxis, ear pain, sore throat.     Past Medical History:  Diagnosis Date  . Asthma 1994  . Bronchitis 1997  . Pneumonia 2013    Patient Active Problem List   Diagnosis Date Noted  . SOB (shortness of breath) 07/24/2017    History reviewed. No pertinent surgical history.     Home Medications    Prior to Admission medications   Medication Sig Start Date End Date Taking? Authorizing Provider  acetaminophen (TYLENOL) 500 MG tablet Take 1,000 mg by mouth every 6 (six) hours as needed (for pain).     [provider]  albuterol (PROVENTIL HFA;VENTOLIN HFA) 108 (90 Base) MCG/ACT inhaler Inhale 2 puffs into the lungs every 4 (four) hours as needed for wheezing or shortness of breath. 09/20/18   Elvina Sidle, MD  predniSONE (DELTASONE) 20 MG tablet Two daily with food 09/20/18   Elvina Sidle, MD    Family History Family History  Problem Relation Age of Onset  . High blood pressure Mother   . Stroke Maternal Grandmother   . COPD Paternal Grandmother   . Asthma Paternal Grandmother     Social History Social History   Tobacco Use  . Smoking status: Former Smoker    Types: Cigars  . Smokeless tobacco: Never Used  Substance Use Topics  . Alcohol use: Yes    Comment: occ  . Drug use: Yes    Frequency: 7.0 times per week    Types: Marijuana     Allergies   Apple   Review of Systems Review of Systems   Physical Exam Triage Vital Signs ED Triage Vitals  Enc Vitals Group     BP 09/20/18 1853 127/79     Pulse Rate  09/20/18 1853 70     Resp 09/20/18 1853 18     Temp 09/20/18 1853 98 F (36.7 C)     Temp Source 09/20/18 1853 Oral     SpO2 09/20/18 1853 96 %     Weight --      Height --      Head Circumference --      Peak Flow --      Pain Score 09/20/18 1854 0     Pain Loc --      Pain Edu? --      Excl. in GC? --    No data found.  Updated Vital Signs BP 127/79 (BP Location: Left Arm)   Pulse 70   Temp 98 F (36.7 C) (Oral)   Resp 18   SpO2 96%    Physical Exam Vitals signs and nursing note reviewed.  Constitutional:      General: He is not in acute distress.    Appearance: Normal appearance. He is normal weight. He is not ill-appearing.  HENT:     Right Ear: Tympanic membrane normal.     Left Ear: Tympanic membrane normal.     Nose: Nose normal.     Mouth/Throat:  Mouth: Mucous membranes are moist.  Eyes:     Conjunctiva/sclera: Conjunctivae normal.  Neck:     Musculoskeletal: Normal range of motion and neck supple.  Cardiovascular:     Rate and Rhythm: Normal rate and regular rhythm.     Heart sounds: Normal heart sounds.  Pulmonary:     Effort: Pulmonary effort is normal.     Breath sounds: Normal breath sounds.  Musculoskeletal: Normal range of motion.  Skin:    General: Skin is warm and dry.  Neurological:     General: No focal deficit present.     Mental Status: He is alert and oriented to person, place, and time.  Psychiatric:        Mood and Affect: Mood normal.        Thought Content: Thought content normal.      UC Treatments / Results  Labs (all labs ordered are listed, but only abnormal results are displayed) Labs Reviewed - No data to display  EKG None  Radiology No results found.  Procedures Procedures (including critical care time)  Medications Ordered in UC Medications - No data to display  Initial Impression / Assessment and Plan / UC Course  I have reviewed the triage vital signs and the nursing notes.  Pertinent labs &  imaging results that were available during my care of the patient were reviewed by me and considered in my medical decision making (see chart for details).    Final Clinical Impressions(s) / UC Diagnoses   Final diagnoses:  Mild intermittent asthma with acute exacerbation  Allergic state, initial encounter   Discharge Instructions   None    ED Prescriptions    Medication Sig Dispense Auth. Provider   albuterol (PROVENTIL HFA;VENTOLIN HFA) 108 (90 Base) MCG/ACT inhaler Inhale 2 puffs into the lungs every 4 (four) hours as needed for wheezing or shortness of breath. 1 Inhaler Elvina Sidle, MD   predniSONE (DELTASONE) 20 MG tablet Two daily with food 10 tablet Elvina Sidle, MD     Controlled Substance Prescriptions [place] Controlled Substance Registry consulted? Not Applicable   Elvina Sidle, MD 09/20/18 1919

## 2018-09-23 ENCOUNTER — Other Ambulatory Visit: Payer: Self-pay

## 2018-09-23 ENCOUNTER — Emergency Department (HOSPITAL_COMMUNITY)
Admission: EM | Admit: 2018-09-23 | Discharge: 2018-09-23 | Disposition: A | Discharge status: Home / Self Care | Payer: Managed Care, Other (non HMO) | Attending: Emergency Medicine | Admitting: Emergency Medicine

## 2018-09-23 ENCOUNTER — Encounter (HOSPITAL_COMMUNITY): Payer: Self-pay

## 2018-09-23 DIAGNOSIS — R0602 Shortness of breath: Secondary | ICD-10-CM | POA: Diagnosis present

## 2018-09-23 DIAGNOSIS — F1210 Cannabis abuse, uncomplicated: Secondary | ICD-10-CM | POA: Insufficient documentation

## 2018-09-23 DIAGNOSIS — Z87891 Personal history of nicotine dependence: Secondary | ICD-10-CM | POA: Diagnosis not present

## 2018-09-23 DIAGNOSIS — J45909 Unspecified asthma, uncomplicated: Secondary | ICD-10-CM | POA: Insufficient documentation

## 2018-09-23 DIAGNOSIS — J4541 Moderate persistent asthma with (acute) exacerbation: Secondary | ICD-10-CM | POA: Diagnosis not present

## 2018-09-23 DIAGNOSIS — R0600 Dyspnea, unspecified: Secondary | ICD-10-CM | POA: Diagnosis not present

## 2018-09-23 MED ORDER — ALBUTEROL SULFATE HFA 108 (90 BASE) MCG/ACT IN AERS
2.0000 | INHALATION_SPRAY | RESPIRATORY_TRACT | Status: DC | PRN
  Filled 2018-09-23: qty 6.7

## 2018-09-23 NOTE — ED Triage Notes (Addendum)
Pt arrived via ems after experiencing shortness of breath at work. Hx of asthma. Pt has inhaler but wasn't with him. Wheezing bilaterally upon arrival so they gave 5mg  albuterol and 125mg  of Solumedrol. Pt feeling better upon arrival.

## 2018-09-23 NOTE — Discharge Instructions (Addendum)
Albuterol inhaler: 2 puffs every 4 hours as needed for wheezing/difficulty breathing.  Return to the emergency department for worsening breathing, high fever, severe chest pain, or other new and concerning symptoms.

## 2018-09-23 NOTE — ED Notes (Signed)
Pt given and verbalized understanding of d/c instructions and need for follow up with pcp. Given albuterol MDI and verbalized understanding of instructions on use. Told to return if s/s worsen. No further questions or distress at this time

## 2018-09-23 NOTE — ED Notes (Signed)
Bed: MP53 Expected date:  Expected time:  Means of arrival:  Comments: 63F SOB

## 2018-09-23 NOTE — ED Provider Notes (Signed)
[facility]-EMERGENCY DEPT Provider Note   CSN: [id number] Arrival date & time: 09/23/18  0107    History   Chief Complaint Chief Complaint  Patient presents with  . Shortness of Breath    HPI Jorge Williams. is a 33 y.o. male.     Patient is a 33 year old male with history of asthma.  He presents today for evaluation of shortness of breath.  He states that he drove to work this evening.  While he was getting out of the car he developed the onset of wheezing and difficulty breathing.  His coworkers called 911 and he was transported here.  He was given an albuterol neb and steroids in route and is now feeling much better.  Patient does have an inhaler, but does not know where it is.  Patient also smokes cigarettes.  The history is provided by the patient.  Shortness of Breath  Severity:  Moderate Onset quality:  Sudden Timing:  Constant Progression:  Resolved Chronicity:  Recurrent Context: pollens   Relieved by: Nebulizer, steroids. Worsened by:  Nothing Ineffective treatments:  None tried   Past Medical History:  Diagnosis Date  . Asthma 1994  . Bronchitis 1997  . Pneumonia 2013    Patient Active Problem List   Diagnosis Date Noted  . SOB (shortness of breath) 07/24/2017    History reviewed. No pertinent surgical history.      Home Medications    Prior to Admission medications   Medication Sig Start Date End Date Taking? Authorizing Provider  acetaminophen (TYLENOL) 500 MG tablet Take 1,000 mg by mouth every 6 (six) hours as needed (for pain).    Yes [provider]  albuterol (PROVENTIL HFA;VENTOLIN HFA) 108 (90 Base) MCG/ACT inhaler Inhale 2 puffs into the lungs every 4 (four) hours as needed for wheezing or shortness of breath. 09/20/18  Yes Elvina Sidle, MD  predniSONE (DELTASONE) 20 MG tablet Two daily with food 09/20/18   Elvina Sidle, MD    Family History Family History  Problem Relation Age of Onset  .  High blood pressure Mother   . Stroke Maternal Grandmother   . COPD Paternal Grandmother   . Asthma Paternal Grandmother     Social History Social History   Tobacco Use  . Smoking status: Former Smoker    Types: Cigars  . Smokeless tobacco: Never Used  Substance Use Topics  . Alcohol use: Yes    Comment: occ  . Drug use: Yes    Frequency: 7.0 times per week    Types: Marijuana     Allergies   Apple   Review of Systems Review of Systems  Respiratory: Positive for shortness of breath.   All other systems reviewed and are negative.    Physical Exam Updated Vital Signs BP 116/73 (BP Location: Left Arm)   Pulse 66   Temp (!) 97.4 F (36.3 C) (Oral)   Resp 19   Ht 6\' 1"  (1.854 m)   Wt 61.2 kg   SpO2 99%   BMI 17.81 kg/m   Physical Exam Vitals signs and nursing note reviewed.  Constitutional:      General: He is not in acute distress.    Appearance: He is well-developed. He is not diaphoretic.  HENT:     Head: Normocephalic and atraumatic.  Neck:     Musculoskeletal: Normal range of motion and neck supple.  Cardiovascular:     Rate and Rhythm: Normal rate and regular rhythm.  Heart sounds: No murmur. No friction rub.  Pulmonary:     Effort: Pulmonary effort is normal. No respiratory distress.     Breath sounds: Normal breath sounds. No wheezing or rales.  Abdominal:     General: Bowel sounds are normal. There is no distension.     Palpations: Abdomen is soft.     Tenderness: There is no abdominal tenderness.  Musculoskeletal: Normal range of motion.     Right lower leg: He exhibits no tenderness. No edema.     Left lower leg: He exhibits no tenderness. No edema.  Skin:    General: Skin is warm and dry.  Neurological:     Mental Status: He is alert and oriented to person, place, and time.     Coordination: Coordination normal.      ED Treatments / Results  Labs (all labs ordered are listed, but only abnormal results are displayed) Labs  Reviewed - No data to display  EKG None  Radiology No results found.  Procedures Procedures (including critical care time)  Medications Ordered in ED Medications  albuterol (PROVENTIL HFA;VENTOLIN HFA) 108 (90 Base) MCG/ACT inhaler 2 puff (has no administration in time range)     Initial Impression / Assessment and Plan / ED Course  I have reviewed the triage vital signs and the nursing notes.  Pertinent labs & imaging results that were available during my care of the patient were reviewed by me and considered in my medical decision making (see chart for details).  Patient presents with complaints of wheezing and difficulty breathing consistent with an attack of his asthma.  This resolved after receiving steroids and albuterol nebs by EMS.  Patient's oxygen saturations are 99% and he is not in any respiratory distress.  Patient will be discharged with an albuterol MDI and follow-up as needed.  He was advised to refrain from cigarette smoking.  Final Clinical Impressions(s) / ED Diagnoses   Final diagnoses:  None    ED Discharge Orders    None       Geoffery Lyons, MD 09/23/18 [phone]

## 2018-11-23 ENCOUNTER — Ambulatory Visit
Admission: EM | Admit: 2018-11-23 | Discharge: 2018-11-23 | Disposition: A | Discharge status: Home / Self Care | Payer: Managed Care, Other (non HMO) | Attending: Emergency Medicine | Admitting: Emergency Medicine

## 2018-11-23 DIAGNOSIS — M546 Pain in thoracic spine: Secondary | ICD-10-CM | POA: Diagnosis not present

## 2018-11-23 DIAGNOSIS — R258 Other abnormal involuntary movements: Secondary | ICD-10-CM

## 2018-11-23 MED ORDER — PREDNISONE 20 MG PO TABS: 20.0000 mg | ORAL_TABLET | Freq: Every day | ORAL | 0 refills | Status: AC

## 2018-11-23 NOTE — ED Triage Notes (Signed)
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 Pt c/o uncontrollable shaking to lt hand and lt foot.

## 2018-11-23 NOTE — Discharge Instructions (Addendum)
Start steroid daily. Use heat/stretches for additional relief.  Go to ER if you develop sever headaches, change in vision, weakness/numbness, worsening of clonus (shaking), or urinary/fecal incontinence (accidental urinating/defecating).  Call primary care office to establish care, Call neurology to follow up/for further eval.

## 2018-11-23 NOTE — ED Provider Notes (Addendum)
[facility]    CSN: [id number] Arrival date & time: 11/23/18  1440     History   Chief Complaint Chief Complaint  Patient presents with  . Back Pain    HPI Jorge E Criss AlvineJohnson Jr. is a 33 y.o. male presenting for acute concern of left mid back pain.  Patient has had this before; works for [employer] and does a lot of heavy lifting.  Patient states that his back pain started on Saturday: Cannot remember specific triggering event, trauma, pop/snap/tear.  Patient denies radiation of pain.  Patient has tried both ibuprofen and Tylenol with mild relief of symptoms.  Patient is noted to have sustained clonus of left foot throughout exam.  Patient states that this started himself to urgent care today.  Patient denies pain in foot, fever, recent illness, drug use, strip seizures, urinary or fecal incontinence, numbness.  Is able to ambulate, though is antalgic favoring left side.  Of note, patient states that his biological brother, who is 33 years old, was diagnosed a month or 2 ago with MS.  No other family members with known neurological, autoimmune disorders.  Patient denies history of headaches/migraines, vertigo, lupus, seizures, diabetes, HIV.    Past Medical History:  Diagnosis Date  . Asthma 1994  . Bronchitis 1997  . Pneumonia 2013    Patient Active Problem List   Diagnosis Date Noted  . SOB (shortness of breath) 07/24/2017    History reviewed. No pertinent surgical history.     Home Medications    Prior to Admission medications   Medication Sig Start Date End Date Taking? Authorizing Provider  acetaminophen (TYLENOL) 500 MG tablet Take 1,000 mg by mouth every 6 (six) hours as needed (for pain).     [provider]  albuterol (PROVENTIL HFA;VENTOLIN HFA) 108 (90 Base) MCG/ACT inhaler Inhale 2 puffs into the lungs every 4 (four) hours as needed for wheezing or shortness of breath. 09/20/18   Elvina SidleLauenstein, Kurt, MD  predniSONE (DELTASONE) 20 MG tablet Take 1  tablet (20 mg total) by mouth daily for 7 days. 11/23/18 11/30/18  Hall-Potvin, [place], PA-C    Family History Family History  Problem Relation Age of Onset  . High blood pressure Mother   . Stroke Maternal Grandmother   . COPD Paternal Grandmother   . Asthma Paternal Grandmother     Social History Social History   Tobacco Use  . Smoking status: Former Smoker    Types: Cigars  . Smokeless tobacco: Never Used  Substance Use Topics  . Alcohol use: Yes    Comment: occ  . Drug use: Yes    Frequency: 7.0 times per week    Types: Marijuana     Allergies   Apple   Review of Systems Review of Systems as per HPI   Physical Exam Triage Vital Signs ED Triage Vitals [11/23/18 1451]  Enc Vitals Group     BP (!) 127/95     Pulse Rate 63     Resp 18     Temp 98 F (36.7 C)     Temp Source Oral     SpO2      Weight      Height      Head Circumference      Peak Flow      Pain Score 6     Pain Loc      Pain Edu?      Excl. in GC?    No data found.  Updated  Vital Signs BP (!) 127/95 (BP Location: Right Arm)   Pulse 63   Temp 98 F (36.7 C) (Oral)   Resp 18   Visual Acuity Right Eye Distance:   Left Eye Distance:   Bilateral Distance:    Right Eye Near:   Left Eye Near:    Bilateral Near:     Physical Exam Constitutional:      General: He is not in acute distress. HENT:     Head: Normocephalic and atraumatic.     Mouth/Throat:     Mouth: Mucous membranes are moist.     Pharynx: No oropharyngeal exudate or posterior oropharyngeal erythema.  Eyes:     General: No visual field deficit or scleral icterus.    Extraocular Movements: Extraocular movements intact.     Conjunctiva/sclera: Conjunctivae normal.     Pupils: Pupils are equal, round, and reactive to light.     Comments: Left eye exotropic, stable/chronic per patient  Neck:     Musculoskeletal: Normal range of motion and neck supple. No neck rigidity or muscular tenderness.  Cardiovascular:      Rate and Rhythm: Normal rate and regular rhythm.     Heart sounds: No murmur.  Pulmonary:     Effort: Pulmonary effort is normal. No respiratory distress.     Breath sounds: Normal breath sounds. No wheezing.  Musculoskeletal:     Comments: No obvious scoliosis or bony deformity.  No rash, erythema, ecchymosis.  Muscles of left mid back tender to palpation, without fluctuance or masses.  No hypertonicity or spasm noted.  Negative for vertebral or paraspinal tenderness, no CVA tenderness. Extremities strength 5/5 bilaterally and symmetric, strength 5 out of 5 in right lower extremity, decreased in left lower extremity.  Lymphadenopathy:     Cervical: No cervical adenopathy.  Neurological:     Mental Status: He is alert and oriented to person, place, and time.     Cranial Nerves: Cranial nerves are intact. No cranial nerve deficit, dysarthria or facial asymmetry.     Sensory: Sensory deficit present.     Coordination: Coordination is intact. Romberg sign negative. Finger-Nose-Finger Test and Heel to [facility] Test normal.     Gait: Gait abnormal.     Deep Tendon Reflexes: Reflexes abnormal.     Reflex Scores:      Brachioradialis reflexes are 2+ on the right side and 2+ on the left side.      Patellar reflexes are 2+ on the right side and 0 on the left side.      Achilles reflexes are 2+ on the right side and 0 on the left side.    Comments: Decreased sensation of the left lower extremity both medial and laterally. Sustained clonus of left foot noted throughout exam.  Psychiatric:        Mood and Affect: Mood normal.        Behavior: Behavior normal.        Thought Content: Thought content normal.        Judgment: Judgment normal.      UC Treatments / Results  Labs (all labs ordered are listed, but only abnormal results are displayed) Labs Reviewed - No data to display  EKG None  Radiology No results found.  Procedures Procedures (including critical care time)  Medications  Ordered in UC Medications - No data to display  Initial Impression / Assessment and Plan / UC Course  I have reviewed the triage vital signs and the nursing notes.  Pertinent  labs & imaging results that were available during my care of the patient were reviewed by me and considered in my medical decision making (see chart for details).     33 year old male presenting for left thoracic back pain.  Noted to have sustained clonus of left foot throughout visit.  Biological brother recently diagnosed with MS.  Patient's history not suspicious for MS flare, has never had neurological consult.  Will treat back pain with steroid in hopes that this reduces/resolves clonus and have patient follow-up with neurology.  Strict ER precautions discussed, patient verbalized understanding. Final Clinical Impressions(s) / UC Diagnoses   Final diagnoses:  Acute left-sided thoracic back pain  Clonus     Discharge Instructions     Start steroid daily. Use heat/stretches for additional relief.  Go to ER if you develop sever headaches, change in vision, weakness/numbness, worsening of clonus (shaking), or urinary/fecal incontinence (accidental urinating/defecating).  Call primary care office to establish care, Call neurology to follow up/for further eval.    ED Prescriptions    Medication Sig Dispense Auth. Provider   predniSONE (DELTASONE) 20 MG tablet Take 1 tablet (20 mg total) by mouth daily for 7 days. 7 tablet Hall-Potvin, [place], PA-C     Controlled Substance Prescriptions [place] Controlled Substance Registry consulted? Not Applicable   Shea Evans, PA-C 11/23/18 1559    Hall-Potvin, [place], [place] 11/23/18 1604

## 2018-12-16 ENCOUNTER — Other Ambulatory Visit: Payer: Self-pay

## 2018-12-16 ENCOUNTER — Ambulatory Visit (INDEPENDENT_AMBULATORY_CARE_PROVIDER_SITE_OTHER): Payer: Managed Care, Other (non HMO) | Admitting: Family Medicine

## 2018-12-16 ENCOUNTER — Encounter: Payer: Self-pay | Admitting: Family Medicine

## 2018-12-16 DIAGNOSIS — R202 Paresthesia of skin: Secondary | ICD-10-CM | POA: Diagnosis not present

## 2018-12-16 DIAGNOSIS — J4521 Mild intermittent asthma with (acute) exacerbation: Secondary | ICD-10-CM | POA: Diagnosis not present

## 2018-12-16 DIAGNOSIS — R200 Anesthesia of skin: Secondary | ICD-10-CM

## 2018-12-16 DIAGNOSIS — R258 Other abnormal involuntary movements: Secondary | ICD-10-CM

## 2018-12-16 MED ORDER — IPRATROPIUM-ALBUTEROL 0.5-2.5 (3) MG/3ML IN SOLN: 3.0000 mL | Freq: Four times a day (QID) | RESPIRATORY_TRACT | 1 refills | Status: DC | PRN

## 2018-12-16 MED ORDER — FLOVENT HFA 44 MCG/ACT IN AERO: INHALATION_SPRAY | RESPIRATORY_TRACT | 12 refills | Status: DC

## 2018-12-16 MED ORDER — ALBUTEROL SULFATE HFA 108 (90 BASE) MCG/ACT IN AERS: 2.0000 | INHALATION_SPRAY | RESPIRATORY_TRACT | 3 refills | Status: DC | PRN

## 2018-12-16 NOTE — Progress Notes (Deleted)
Called patient to initiate their telephone visit with provider Molli Barrows, FNP. Verified date of birth. States that he's having to use inhaler more frequently. Has c/o [facility] & cough. Was also told by urgent care to call Neurology to be seen for Clonus. Neurology is requiring referral from PCP. KWalker, CMA.

## 2018-12-16 NOTE — Progress Notes (Signed)
Virtual Visit via Telephone Note  I connected with Jorge E Criss AlvineJohnson Jr. on 12/16/18 at  4:10 PM EDT by telephone and verified that I am speaking with the correct person using two identifiers.  Location: Patient: Located at home during today's encounter  Provider: Located at primary care office     I discussed the limitations, risks, security and privacy concerns of performing an evaluation and management service by telephone and the availability of in person appointments. I also discussed with the patient that there may be a patient responsible charge related to this service. The patient expressed understanding and agreed to proceed.   History of Present Illness: Asthma Jorge Williams is a current smoker. History of asthma since elementary school. Improved around middle or high school. Recently, Jorge Williams notes increased use of albuterol inhaler. Jorge Williams endorses triggers include pollen and humidity increases asthma exacerbation.  On average Jorge Williams is using albuterol twice daily.  Symptoms include shortness of breath, coughing and wheezing.  Symptoms are relieved temporarily with albuterol. Jorge Williams does not have a nebulizer machine. Jorge Williams has had no prior prescriptions for maintenance inhaler.    Clonus  Left side of the body shaking involuntary. Specifically Jorge Williams complains of left hand and left foot involuntary movement. These movements have been reoccurring for over the last several months. Jorge Williams is concern as his brother was recently diagnosed with MS. Jorge Williams has no history of seizure disorder or head injuries.  Jorge Williams endorses pins and needle pain both upper and lower extremities.  No known history of diabetes or thyroid disorder. Social History   Socioeconomic History  . Marital status: Married    Spouse name: Not on file  . Number of children: Not on file  . Years of education: Not on file  . Highest education level: Not on file  Occupational History  . Not on file  Social Needs  . Financial resource strain: Not on file  . Food  insecurity    Worry: Not on file    Inability: Not on file  . Transportation needs    Medical: Not on file    Non-medical: Not on file  Tobacco Use  . Smoking status: Former Smoker    Types: Cigars  . Smokeless tobacco: Never Used  Substance and Sexual Activity  . Alcohol use: Yes    Comment: occ  . Drug use: Yes    Frequency: 7.0 times per week    Types: Marijuana  . Sexual activity: Not on file  Lifestyle  . Physical activity    Days per week: Not on file    Minutes per session: Not on file  . Stress: Not on file  Relationships  . Social Musicianconnections    Talks on phone: Not on file    Gets together: Not on file    Attends religious service: Not on file    Active member of club or organization: Not on file    Attends meetings of clubs or organizations: Not on file    Relationship status: Not on file  . Intimate partner violence    Fear of current or ex partner: Not on file    Emotionally abused: Not on file    Physically abused: Not on file    Forced sexual activity: Not on file  Other Topics Concern  . Not on file  Social History Narrative   ** Merged History Encounter **         Family History  Problem Relation Age of Onset  . High blood  pressure Mother   . Hypertension Mother   . Multiple sclerosis Brother   . Stroke Maternal Grandmother   . Hypertension Maternal Grandmother   . Hypertension Maternal Grandfather   . COPD Paternal Grandmother   . Asthma Paternal Grandmother     Assessment and Plan: 1. Clonus -Unknown etiology.  Patient does endorse marijuana usage which could possibly be related.  However given family history of MS will refer to neurology for evaluation of persistent symptoms. - Ambulatory referral to Neurology  2. Mild intermittent asthma with acute exacerbation We will start a maintenance inhaler fluticasone 2 puffs twice daily regardless of symptoms. Continue albuterol inhaler - DME Nebulizer machine, [facility]  Patient should use duo nebs as needed for shortness of breath and active wheezing.  3. Numbness and tingling of left upper and lower extremity -Referral placed to neurology for further work-up and evaluation of symptoms.   Follow Up Instructions: 6 weeks for asthma follow-up and fasting labs    I discussed the assessment and treatment plan with the patient. The patient was provided an opportunity to ask questions and all were answered. The patient agreed with the plan and demonstrated an understanding of the instructions.   The patient was advised to call back or seek an in-person evaluation if the symptoms worsen or if the condition fails to improve as anticipated.  I provided 30 minutes of non-face-to-face time during this encounter.   Molli Barrows, FNP

## 2019-01-17 ENCOUNTER — Ambulatory Visit (INDEPENDENT_AMBULATORY_CARE_PROVIDER_SITE_OTHER): Payer: Managed Care, Other (non HMO) | Admitting: Neurology

## 2019-01-17 ENCOUNTER — Encounter: Payer: Self-pay | Admitting: Neurology

## 2019-01-17 ENCOUNTER — Other Ambulatory Visit: Payer: Self-pay

## 2019-01-17 ENCOUNTER — Telehealth: Payer: Self-pay | Admitting: Neurology

## 2019-01-17 DIAGNOSIS — R251 Tremor, unspecified: Secondary | ICD-10-CM | POA: Diagnosis not present

## 2019-01-17 DIAGNOSIS — R202 Paresthesia of skin: Secondary | ICD-10-CM

## 2019-01-17 NOTE — Telephone Encounter (Signed)
[employer] order sent to [facility]. They will obtain the auth and reach out to the patient to schedule.  °

## 2019-01-17 NOTE — Progress Notes (Signed)
PATIENT: Jorge E Criss AlvineJohnson Jr. DOB: 05-19-1986  Chief Complaint  Patient presents with  . Clonus    Notes from PCP: Left side of the body shaking involuntary. Specifically he complains of left hand and left foot involuntary movement. These movements have been reoccurring for over the last several months. He is concern as his brother was recently diagnosed with MS. He has no history of seizure disorder or head injuries.  He endorses pins and needle pain both upper and lower extremities.  No known history of diabetes or thyroid disorder.  Jorge Williams. PCP    Bing NeighborsHarris, Kimberly S, FNP     HISTORICAL  Jorge E Criss AlvineJohnson Jr. is a 33 year old male, seen in request by his primary care nurse practitioner Joaquin CourtsHarris, Kimberly for evaluation of uncontrolled left upper lower extremity abnormal movement, initial evaluation was on January 17, 2019.  I have reviewed and summarized the referring note from the referring physician.  His began to have intermittent left upper and lower extremity tremor since teenager, it only happen intermittently, getting worse since May 2020, it happens once or twice each week, usually triggered by stress, he has uncontrollable left hand shaking, sometimes left leg, it does not necessarily happen at the same time, he denied loss of consciousness, lasting for 5 minutes, his brother was recently diagnosed with multiple sclerosis, which has caused a lot of anxiety in him.  He denies gait abnormality.  He denies visual loss.  REVIEW OF SYSTEMS: Full 14 system review of systems performed and notable only for as above All other review of systems were negative.  ALLERGIES: Allergies  Allergen Reactions  . Apple Itching    Apples make the gums itch    HOME MEDICATIONS: Current Outpatient Medications  Medication Sig Dispense Refill  . albuterol (VENTOLIN HFA) 108 (90 Base) MCG/ACT inhaler Inhale 2 puffs into the lungs every 4 (four) hours as needed for wheezing or shortness of breath (cough,  shortness of breath or wheezing.). 1 Inhaler 3  . fluticasone (FLOVENT HFA) 44 MCG/ACT inhaler Inhale 2 puffs twice daily 1 Inhaler 12  . ipratropium-albuterol (DUONEB) 0.5-2.5 (3) MG/3ML SOLN Take 3 mLs by nebulization every 6 (six) hours as needed. 360 mL 1   No current facility-administered medications for this visit.     PAST MEDICAL HISTORY: Past Medical History:  Diagnosis Date  . Asthma 1994  . Bronchitis 1997  . Pneumonia 2013    PAST SURGICAL HISTORY: History reviewed. No pertinent surgical history.  FAMILY HISTORY: Family History  Problem Relation Age of Onset  . Hypertension Mother   . Healthy Father   . Multiple sclerosis Brother   . Stroke Maternal Grandmother   . Hypertension Maternal Grandmother   . Hypertension Maternal Grandfather   . COPD Paternal Grandmother   . Asthma Paternal Grandmother     SOCIAL HISTORY: Social History   Socioeconomic History  . Marital status: Married    Spouse name: Not on file  . Number of children: 1  . Years of education: some college  . Highest education level: Not on file  Occupational History  . Occupation: Loads packages  Social Needs  . Financial resource strain: Not on file  . Food insecurity    Worry: Not on file    Inability: Not on file  . Transportation needs    Medical: Not on file    Non-medical: Not on file  Tobacco Use  . Smoking status: Former Smoker    Types: Cigars  . Smokeless  tobacco: Never Used  Substance and Sexual Activity  . Alcohol use: Yes    Comment: occ  . Drug use: Yes    Frequency: 7.0 times per week    Types: Marijuana  . Sexual activity: Not on file  Lifestyle  . Physical activity    Days per week: Not on file    Minutes per session: Not on file  . Stress: Not on file  Relationships  . Social Herbalist on phone: Not on file    Gets together: Not on file    Attends religious service: Not on file    Active member of club or organization: Not on file    Attends  meetings of clubs or organizations: Not on file    Relationship status: Not on file  . Intimate partner violence    Fear of current or ex partner: Not on file    Emotionally abused: Not on file    Physically abused: Not on file    Forced sexual activity: Not on file  Other Topics Concern  . Not on file  Social History Narrative   Lives at home with his wife and daughter.   Right-handed.   One bottle of [employer] iced coffee.         PHYSICAL EXAM   Vitals:   01/17/19 0858  BP: 127/80  Pulse: 68  Temp: 97.6 F (36.4 C)  Weight: 136 lb 8 oz (61.9 kg)  Height: 6\' 1"  (1.854 m)    Not recorded      Body mass index is 18.01 kg/m.  PHYSICAL EXAMNIATION:  Gen: NAD, conversant, well nourised, obese, well groomed                     Cardiovascular: Regular rate rhythm, no peripheral edema, warm, nontender. Eyes: Conjunctivae clear without exudates or hemorrhage Neck: Supple, no carotid bruits. Pulmonary: Clear to auscultation bilaterally   NEUROLOGICAL EXAM:  MENTAL STATUS: Speech:    Speech is normal; fluent and spontaneous with normal comprehension.  Cognition:     Orientation to time, place and person     Normal recent and remote memory     Normal Attention span and concentration     Normal Language, naming, repeating,spontaneous speech     Fund of knowledge   CRANIAL NERVES: CN II: Visual fields are full to confrontation.  Pupils are round equal and briskly reactive to light. CN III, IV, VI: extraocular movement are normal. No ptosis. CN V: Facial sensation is intact to pinprick in all 3 divisions bilaterally. Corneal responses are intact.  CN VII: Face is symmetric with normal eye closure and smile. CN VIII: Hearing is normal to rubbing fingers CN IX, X: Palate elevates symmetrically. Phonation is normal. CN XI: Head turning and shoulder shrug are intact CN XII: Tongue is midline with normal movements and no atrophy.  MOTOR: There is no pronator drift of  out-stretched arms. Muscle bulk and tone are normal. Muscle strength is normal.  REFLEXES: Reflexes are 2+ and symmetric at the biceps, triceps, knees, and ankles. Plantar responses are flexor.  SENSORY: Intact to light touch, pinprick, positional sensation and vibratory sensation are intact in fingers and toes.  COORDINATION: Rapid alternating movements and fine finger movements are intact. There is no dysmetria on finger-to-nose and heel-knee-shin.    GAIT/STANCE: Posture is normal. Gait is steady with normal steps, base, arm swing, and turning. Heel and toe walking are normal. Tandem gait is normal.  Romberg is absent.   DIAGNOSTIC DATA (LABS, IMAGING, TESTING) - I reviewed patient records, labs, notes, testing and imaging myself where available.   ASSESSMENT AND PLAN  Arwin E Criss AlvineJohnson Jr. is a 33 y.o. male   Intermittent left upper and lower extremity tremor  MRI of the brain to rule out right hemisphere pathology  Laboratory evaluations  Levert FeinsteinYijun Saifan Rayford, M.D. Ph.D.  [facility] [address], [address] [place], [place] [address] Ph: [phone] Fax: [phone]  CC: Bing NeighborsHarris, Kimberly S, FNP

## 2019-01-18 ENCOUNTER — Telehealth: Payer: Self-pay | Admitting: Neurology

## 2019-01-18 LAB — RPR: RPR Ser Ql: NONREACTIVE

## 2019-01-18 LAB — CBC WITH DIFFERENTIAL
Basophils Absolute: 0 10*3/uL (ref 0.0–0.2)
Basos: 1 %
EOS (ABSOLUTE): 0.2 10*3/uL (ref 0.0–0.4)
Eos: 5 %
Hematocrit: 39 % (ref 37.5–51.0)
Hemoglobin: 13.1 g/dL (ref 13.0–17.7)
Immature Grans (Abs): 0 10*3/uL (ref 0.0–0.1)
Immature Granulocytes: 0 %
Lymphocytes Absolute: 2.3 10*3/uL (ref 0.7–3.1)
Lymphs: 53 %
MCH: 30.5 pg (ref 26.6–33.0)
MCHC: 33.6 g/dL (ref 31.5–35.7)
MCV: 91 fL (ref 79–97)
Monocytes Absolute: 0.4 10*3/uL (ref 0.1–0.9)
Monocytes: 8 %
Neutrophils Absolute: 1.4 10*3/uL (ref 1.4–7.0)
Neutrophils: 33 %
RBC: 4.29 x10E6/uL (ref 4.14–5.80)
RDW: 13.2 % (ref 11.6–15.4)
WBC: 4.3 10*3/uL (ref 3.4–10.8)

## 2019-01-18 LAB — ENA+DNA/DS+SJORGEN'S
ENA RNP Ab: 0.2 AI (ref 0.0–0.9)
ENA SM Ab Ser-aCnc: <0.2 AI (ref 0.0–0.9)
ENA SSA (RO) Ab: <0.2 AI (ref 0.0–0.9)
ENA SSB (LA) Ab: <0.2 AI (ref 0.0–0.9)
dsDNA Ab: 50 IU/mL — ABNORMAL HIGH (ref 0–9)

## 2019-01-18 LAB — COMPREHENSIVE METABOLIC PANEL
ALT: 6 IU/L (ref 0–44)
AST: 14 IU/L (ref 0–40)
Albumin/Globulin Ratio: 1.9 (ref 1.2–2.2)
Albumin: 4.5 g/dL (ref 4.0–5.0)
Alkaline Phosphatase: 50 IU/L (ref 39–117)
BUN/Creatinine Ratio: 14 (ref 9–20)
BUN: 14 mg/dL (ref 6–20)
Bilirubin Total: 0.2 mg/dL (ref 0.0–1.2)
CO2: 23 mmol/L (ref 20–29)
Calcium: 9.5 mg/dL (ref 8.7–10.2)
Chloride: 104 mmol/L (ref 96–106)
Creatinine, Ser: 0.98 mg/dL (ref 0.76–1.27)
GFR calc Af Amer: 117 mL/min/{1.73_m2} (ref >59)
GFR calc non Af Amer: 101 mL/min/{1.73_m2} (ref >59)
Globulin, Total: 2.4 g/dL (ref 1.5–4.5)
Glucose: 91 mg/dL (ref 65–99)
Potassium: 5 mmol/L (ref 3.5–5.2)
Sodium: 138 mmol/L (ref 134–144)
Total Protein: 6.9 g/dL (ref 6.0–8.5)

## 2019-01-18 LAB — TSH: TSH: 1.29 u[IU]/mL (ref 0.450–4.500)

## 2019-01-18 LAB — SEDIMENTATION RATE: Sed Rate: 3 mm/hr (ref 0–15)

## 2019-01-18 LAB — CK: Total CK: 88 U/L (ref 49–439)

## 2019-01-18 LAB — C-REACTIVE PROTEIN: CRP: <1 mg/L (ref 0–10)

## 2019-01-18 LAB — ANA W/REFLEX: Anti Nuclear Antibody (ANA): POSITIVE — AB

## 2019-01-18 LAB — VITAMIN B12: Vitamin B-12: 936 pg/mL (ref 232–1245)

## 2019-01-18 NOTE — Telephone Encounter (Signed)
Please call patient, extensive laboratory evaluation showed positive ANA with double-stranded DNA, above findings could be nonspecific, or indicated autoimmune disease such as lupus,  Check to see if patient has any signs of joint pain, skin discoloration, muscle achy pain, if not, may consider repeat test with his primary care physician later, if he has above symptoms, may consider refer him to rheumatologist.

## 2019-01-18 NOTE — Telephone Encounter (Signed)
I called the patient.  He denies joint pain or skin discoloration.  Reports an occasional muscle spasm in his left calf when he tilts his foot a certain direction.    Says he has a follow up scheduled on 01/27/2019 with his PCP.

## 2019-01-26 ENCOUNTER — Telehealth: Payer: Self-pay

## 2019-01-26 NOTE — Telephone Encounter (Signed)
Called patient to do their pre-visit COVID screening.  Have you been tested for COVID or are you currently waiting for COVID test results? no  Have you recently traveled internationally([place], [place], [place], [place], [place]) or within the [place] to a hotspot area([place], [place], [place], [place], [place])? no  Are you currently experiencing any of the following: fever, cough, SHOB, fatigue, body aches, loss of smell, rash, diarrhea, vomiting, severe headaches, weakness, sore throat? SHOB  Have you been in contact with anyone who has recently travelled? no  Have you been in contact with anyone who is experiencing any of the above symptoms or been diagnosed with COVID  or works in or has recently visited a SNF? no  Asked patient to come fasting for updated labs.

## 2019-01-27 ENCOUNTER — Ambulatory Visit: Payer: Managed Care, Other (non HMO) | Admitting: Family Medicine

## 2019-01-30 ENCOUNTER — Ambulatory Visit
Admission: RE | Admit: 2019-01-30 | Discharge: 2019-01-30 | Disposition: A | Discharge status: Home / Self Care | Payer: Managed Care, Other (non HMO) | Source: Ambulatory Visit | Attending: Neurology | Admitting: Neurology

## 2019-01-30 ENCOUNTER — Other Ambulatory Visit: Payer: Self-pay

## 2019-01-30 DIAGNOSIS — R202 Paresthesia of skin: Secondary | ICD-10-CM

## 2019-01-30 DIAGNOSIS — R251 Tremor, unspecified: Secondary | ICD-10-CM | POA: Diagnosis not present

## 2019-02-02 ENCOUNTER — Telehealth: Payer: Self-pay

## 2019-02-02 NOTE — Telephone Encounter (Signed)
Called patient to do their pre-visit COVID screening.  Call went to voicemail. Unable to do prescreening.  

## 2019-02-03 ENCOUNTER — Ambulatory Visit (INDEPENDENT_AMBULATORY_CARE_PROVIDER_SITE_OTHER): Payer: Managed Care, Other (non HMO) | Admitting: Family Medicine

## 2019-02-03 ENCOUNTER — Other Ambulatory Visit: Payer: Self-pay

## 2019-02-03 ENCOUNTER — Telehealth: Payer: Self-pay

## 2019-02-03 ENCOUNTER — Encounter: Payer: Self-pay | Admitting: Family Medicine

## 2019-02-03 VITALS — BP 131/88 | HR 64 | Temp 97.3°F | Resp 17 | Ht 72.0 in | Wt 131.0 lb

## 2019-02-03 DIAGNOSIS — R768 Other specified abnormal immunological findings in serum: Secondary | ICD-10-CM

## 2019-02-03 DIAGNOSIS — J4521 Mild intermittent asthma with (acute) exacerbation: Secondary | ICD-10-CM | POA: Diagnosis not present

## 2019-02-03 DIAGNOSIS — R251 Tremor, unspecified: Secondary | ICD-10-CM | POA: Diagnosis not present

## 2019-02-03 DIAGNOSIS — F419 Anxiety disorder, unspecified: Secondary | ICD-10-CM

## 2019-02-03 DIAGNOSIS — F329 Major depressive disorder, single episode, unspecified: Secondary | ICD-10-CM

## 2019-02-03 MED ORDER — HYDROXYZINE HCL 25 MG PO TABS: 25.0000 mg | ORAL_TABLET | Freq: Three times a day (TID) | ORAL | 1 refills | Status: DC | PRN

## 2019-02-03 MED ORDER — FLUOXETINE HCL 20 MG PO TABS: 20.0000 mg | ORAL_TABLET | Freq: Every day | ORAL | 3 refills | Status: DC

## 2019-02-03 NOTE — Progress Notes (Signed)
Subjective:  Patient ID: Jorge Glancearryl E Sok Jr., male    DOB: 05/28/1986  Age: 33 y.o. MRN: [id number]  CC: Asthma   HPI Jorge E Criss AlvineJohnson Jr. 33 year old male with history of asthma who presents today for follow-up of his asthma after Flovent have been added to his regimen.  He reports asthma is better controlled. He was referred to neurology to tremors of his left hand and foot.  Visit with neurology occurred on 02/03/2019 and MRI of the brain which are normal however he had labs that revealed positive ANA and positive double-stranded DNA and he was told to follow-up here. He denies a personal or family history of lupus. With regards to his tremors they are more in his left thumb and left foot and he has showed me a video of this occurring.  It is spontaneous and can sometimes occur on his right hand.  He drinks alcohol socially about once every 3 to 4 months. On his PHQ 9 score he scored a 23 and his GAD 7 he scored a 20.  He endorses feeling depressed and anxious due to feeling like a failure when he compares himself to other people.  He has had suicidal ideations and intent in the past but has never followed through and currently does not have a plan.  Past Medical History:  Diagnosis Date  . Asthma 1994  . Bronchitis 1997  . Pneumonia 2013    No past surgical history on file.  Family History  Problem Relation Age of Onset  . Hypertension Mother   . Healthy Father   . Multiple sclerosis Brother   . Stroke Maternal Grandmother   . Hypertension Maternal Grandmother   . Hypertension Maternal Grandfather   . COPD Paternal Grandmother   . Asthma Paternal Grandmother     Allergies  Allergen Reactions  . Apple Itching    Apples make the gums itch    Outpatient Medications Prior to Visit  Medication Sig Dispense Refill  . albuterol (VENTOLIN HFA) 108 (90 Base) MCG/ACT inhaler Inhale 2 puffs into the lungs every 4 (four) hours as needed for wheezing or shortness of breath (cough,  shortness of breath or wheezing.). 1 Inhaler 3  . fluticasone (FLOVENT HFA) 44 MCG/ACT inhaler Inhale 2 puffs twice daily 1 Inhaler 12  . ipratropium-albuterol (DUONEB) 0.5-2.5 (3) MG/3ML SOLN Take 3 mLs by nebulization every 6 (six) hours as needed. (Patient not taking: Reported on 02/03/2019) 360 mL 1   No facility-administered medications prior to visit.      ROS Review of Systems  Constitutional: Negative for activity change and appetite change.  HENT: Negative for sinus pressure and sore throat.   Eyes: Negative for visual disturbance.  Respiratory: Negative for cough, chest tightness and shortness of breath.   Cardiovascular: Negative for chest pain and leg swelling.  Gastrointestinal: Negative for abdominal distention, abdominal pain, constipation and diarrhea.  Endocrine: Negative.   Genitourinary: Negative for dysuria.  Musculoskeletal: Negative for joint swelling and myalgias.  Skin: Negative for rash.  Allergic/Immunologic: Negative.   Neurological: Negative for weakness, light-headedness and numbness.  Psychiatric/Behavioral: Positive for dysphoric mood and suicidal ideas.       Positive for anxiety    Objective:  BP 131/88   Pulse 64   Temp (!) 97.3 F (36.3 C) (Temporal)   Resp 17   Ht 6' (1.829 m)   Wt 131 lb (59.4 kg)   SpO2 96%   BMI 17.77 kg/m   BP/Weight 02/03/2019 01/17/2019 11/23/2018  Systolic BP 774 128 786  Diastolic BP 88 80 95  Wt. (Lbs) 131 136.5 -  BMI 17.77 18.01 -      Physical Exam Constitutional:      Appearance: He is well-developed.  Cardiovascular:     Rate and Rhythm: Normal rate.     Heart sounds: Normal heart sounds. No murmur.  Pulmonary:     Effort: Pulmonary effort is normal.     Breath sounds: Normal breath sounds. No wheezing or rales.  Chest:     Chest wall: No tenderness.  Abdominal:     General: Bowel sounds are normal. There is no distension.     Palpations: Abdomen is soft. There is no mass.     Tenderness: There  is no abdominal tenderness.  Musculoskeletal: Normal range of motion.  Neurological:     Mental Status: He is alert and oriented to person, place, and time.     CMP Latest Ref Rng & Units 01/17/2019 07/08/2017 09/16/2015  Glucose 65 - 99 mg/dL 91 108(H) 101(H)  BUN 6 - 20 mg/dL 14 9 23(H)  Creatinine 0.76 - 1.27 mg/dL 0.98 0.86 0.94  Sodium 134 - 144 mmol/L 138 135 143  Potassium 3.5 - 5.2 mmol/L 5.0 3.7 3.9  Chloride 96 - 106 mmol/L 104 105 106  CO2 20 - 29 mmol/L 23 24 28   Calcium 8.7 - 10.2 mg/dL 9.5 9.4 9.7  Total Protein 6.0 - 8.5 g/dL 6.9 - 6.9  Total Bilirubin 0.0 - 1.2 mg/dL 0.2 - 0.4  Alkaline Phos 39 - 117 IU/L 50 - 54  AST 0 - 40 IU/L 14 - 19  ALT 0 - 44 IU/L 6 - 16(L)    Lipid Panel  No results found for: CHOL, TRIG, HDL, CHOLHDL, VLDL, LDLCALC, LDLDIRECT  CBC    Component Value Date/Time   WBC 4.3 01/17/2019 0941   WBC 6.4 07/08/2017 1627   RBC 4.29 01/17/2019 0941   RBC 4.31 07/08/2017 1627   HGB 13.1 01/17/2019 0941   HCT 39.0 01/17/2019 0941   PLT 237 07/08/2017 1627   MCV 91 01/17/2019 0941   MCH 30.5 01/17/2019 0941   MCH 29.0 07/08/2017 1627   MCHC 33.6 01/17/2019 0941   MCHC 33.3 07/08/2017 1627   RDW 13.2 01/17/2019 0941   LYMPHSABS 2.3 01/17/2019 0941   MONOABS 0.4 06/30/2008 0231   EOSABS 0.2 01/17/2019 0941   BASOSABS 0.0 01/17/2019 0941    No results found for: HGBA1C  Assessment & Plan:   1. Mild intermittent asthma with acute exacerbation Controlled on Flovent and albuterol  2. Tremor Unknown etiology Follow-up with neurology  3. Elevated antinuclear antibody (ANA) level We will order labs and if abnormal will refer to rheumatology - ANA,IFA RA Diag Pnl w/rflx Tit/Patn - Anti-DNA antibody, double-stranded - Anti-Smooth Muscle Antibody, IGG - Cardiolipin antibody - Complement, total - C3 and C4 - C-reactive protein  4. Anxiety and depression LCSW called to evaluate the patient He will need to follow-up in 6 weeks Discussed  crisis line - FLUoxetine (PROZAC) 20 MG tablet; Take 1 tablet (20 mg total) by mouth daily.  Dispense: 30 tablet; Refill: 3 - hydrOXYzine (ATARAX/VISTARIL) 25 MG tablet; Take 1 tablet (25 mg total) by mouth 3 (three) times daily as needed.  Dispense: 60 tablet; Refill: 1    Meds ordered this encounter  Medications  . FLUoxetine (PROZAC) 20 MG tablet    Sig: Take 1 tablet (20 mg total) by mouth daily.  Dispense:  30 tablet    Refill:  3  . hydrOXYzine (ATARAX/VISTARIL) 25 MG tablet    Sig: Take 1 tablet (25 mg total) by mouth 3 (three) times daily as needed.    Dispense:  60 tablet    Refill:  1    Follow-up: Return in about 6 weeks (around 03/17/2019) for anxiety and depression.       Hoy RegisterEnobong Talar Fraley, MD, FAAFP. [facility] [place], [place] [phone]   02/03/2019, 10:06 AM

## 2019-02-03 NOTE — Telephone Encounter (Signed)
Hey just following up on our phone call.  When asked the PHQ2 questions patient answered 0s. On completion of his PHQ9 # 9 was a 3. Patient states he has no plans at the moment just thoughts.

## 2019-02-03 NOTE — Patient Instructions (Signed)

## 2019-02-05 LAB — ANA,IFA RA DIAG PNL W/RFLX TIT/PATN
ANA Titer 1: NEGATIVE
Cyclic Citrullin Peptide Ab: 3 units (ref 0–19)
Rheumatoid fact SerPl-aCnc: <10 IU/mL (ref 0.0–13.9)

## 2019-02-05 LAB — COMPLEMENT, TOTAL: Compl, Total (CH50): 50 U/mL (ref >41)

## 2019-02-05 LAB — SPECIMEN STATUS REPORT

## 2019-02-05 LAB — ANTI-DNA ANTIBODY, DOUBLE-STRANDED: dsDNA Ab: 58 IU/mL — ABNORMAL HIGH (ref 0–9)

## 2019-02-05 LAB — C3 AND C4
Complement C3, Serum: 94 mg/dL (ref 82–167)
Complement C4, Serum: 15 mg/dL (ref 14–44)

## 2019-02-05 LAB — C-REACTIVE PROTEIN: CRP: <1 mg/L (ref 0–10)

## 2019-02-05 LAB — ANTI-SMOOTH MUSCLE ANTIBODY, IGG: Smooth Muscle Ab: 6 Units (ref 0–19)

## 2019-02-07 LAB — SPECIMEN STATUS REPORT

## 2019-02-09 ENCOUNTER — Other Ambulatory Visit: Payer: Managed Care, Other (non HMO)

## 2019-02-15 ENCOUNTER — Ambulatory Visit (INDEPENDENT_AMBULATORY_CARE_PROVIDER_SITE_OTHER): Payer: Managed Care, Other (non HMO) | Admitting: Licensed Clinical Social Worker

## 2019-02-15 ENCOUNTER — Encounter: Payer: Self-pay | Admitting: Family Medicine

## 2019-02-15 ENCOUNTER — Other Ambulatory Visit: Payer: Self-pay

## 2019-02-15 ENCOUNTER — Other Ambulatory Visit: Payer: Self-pay | Admitting: Family Medicine

## 2019-02-15 DIAGNOSIS — F419 Anxiety disorder, unspecified: Secondary | ICD-10-CM | POA: Diagnosis not present

## 2019-02-15 DIAGNOSIS — F322 Major depressive disorder, single episode, severe without psychotic features: Secondary | ICD-10-CM | POA: Diagnosis not present

## 2019-02-15 DIAGNOSIS — Z13228 Encounter for screening for other metabolic disorders: Secondary | ICD-10-CM

## 2019-02-18 NOTE — BH Specialist Note (Signed)
Integrated Behavioral Health Initial Visit  MRN: [id number] Name: Jorge Williams.  Number of [facility] Clinician visits:: 1/6 Session Start time: 1:50 PM  Session End time: 2:15 PM Total time: 25 minutes  Type of Service: [place] Interpretor:No. Interpretor Name and Language: NA    SUBJECTIVE: Jorge Williams. is a 33 y.o. male accompanied by self Patient was referred by Dr. Margarita Rana for depression and anxiety. Patient reports the following symptoms/concerns: Difficulty managing mental health triggered by feelings of anxiety about health. Reports comparing self to others often resulting in negative feelings of self Duration of problem: Ongoing; Severity of problem: severe  OBJECTIVE: Mood: Anxious and Depressed and Affect: Appropriate Risk of harm to self or others: No plan to harm self or others Pt scored positive on phq9; however, denied SI/HI. Protective factors identified and crisis intervention resources provided  LIFE CONTEXT: Family and Social: Pt receives support from spouse and their minor daughter. He has a sibling that resides locally  School/Work: Pt is employed and has private insurance Self-Care: Pt is open to medication management. He currently smokes marijuana daily to cope with stressors, in addition, to playing video games Life Changes: Pt has difficulty managing mental health.   STRENGTHS: Pt has a strong support system Pt has good insight to triggers of his depression and anxiety Pt is willing to participate in behavioral health services  GOALS ADDRESSED: Patient will: 1. Reduce symptoms of: anxiety and depression 2. Increase knowledge and/or ability of: coping skills and healthy habits  3. Demonstrate ability to: Increase healthy adjustment to current life circumstances and Increase adequate support systems for patient/family  INTERVENTIONS: Interventions utilized: Solution-Focused Strategies,  Supportive Counseling and Psychoeducation and/or Health Education  Standardized Assessments completed: GAD-7 and PHQ 2&9 with C-SSRS  ASSESSMENT: Patient currently experiencing depression and anxiety triggered by concerns for medical health. Pt's brother was diagnosed with MS and pt is concerned that he may have an autoimmune disease, as well. He reports often comparing self to others and feelings of inadequacy. Pt scored positive on phq9; however, denies current SI/HI. Protective factors were identified and crisis intervention resources provided.   Patient may benefit from psychotherapy and medication management. Pt is open to both. LCSW discussed correlation between one's physical and mental health, in addition, to how stress can negatively impact both. Healthy coping skills were discussed to assist in management/decrease of symptoms.   PLAN: 1. Follow up with behavioral health clinician on : Schedule follow up appointment  2. Behavioral recommendations: Utilize healthy coping skills discussed in session.  3. Referral(s): [facility] (In Clinic) 4. "From scale of 1-10, how likely are you to follow plan?": 8  Rebekah Chesterfield, LCSW 02/24/2019 3:12 PM

## 2019-03-02 ENCOUNTER — Ambulatory Visit (INDEPENDENT_AMBULATORY_CARE_PROVIDER_SITE_OTHER): Payer: Managed Care, Other (non HMO) | Admitting: Licensed Clinical Social Worker

## 2019-03-02 ENCOUNTER — Other Ambulatory Visit: Payer: Self-pay

## 2019-03-02 ENCOUNTER — Encounter: Payer: Self-pay | Admitting: Family Medicine

## 2019-03-02 DIAGNOSIS — F419 Anxiety disorder, unspecified: Secondary | ICD-10-CM | POA: Diagnosis not present

## 2019-03-02 DIAGNOSIS — F331 Major depressive disorder, recurrent, moderate: Secondary | ICD-10-CM

## 2019-03-04 ENCOUNTER — Telehealth: Payer: Self-pay | Admitting: Licensed Clinical Social Worker

## 2019-03-04 NOTE — BH Specialist Note (Signed)
Integrated Behavioral Health Follow Up Visit  MRN: [id number] Name: Jorge Williams.  Number of [facility] Clinician visits: 2/6 Session Start time: 10:15 AM  Session End time: 11:00 AM Total time: 45 minutes  Type of Service: [place] Interpretor:No. Interpretor Name and Language: NA  SUBJECTIVE: Jorge Williams. is a 33 y.o. male accompanied by self Patient was referred by NP Harris for depression and anxiety. Patient reports the following symptoms/concerns: Pt reports difficulty managing with anger Duration of problem: Ongoing; Severity of problem: moderate  OBJECTIVE: Mood: Appropriate and Affect: Appropriate Risk of harm to self or others: No plan to harm self or others  LIFE CONTEXT: Family and Social: Pt receives support from family School/Work: Pt is employed Self-Care: Pt is open to medication management. He currently smokes marijuana daily to cope with stressors, in addition, to playing video games Life Changes: Pt is currently separated from spouse. He is residing with family and hopes to reunite with spouse and daughter in the near future  GOALS ADDRESSED: Patient will: 1.  Reduce symptoms of: agitation  2.  Increase knowledge and/or ability of: self-management skills  3.  Demonstrate ability to: Increase healthy adjustment to current life circumstances  INTERVENTIONS: Interventions utilized:  Brief CBT Standardized Assessments completed: Not Needed  ASSESSMENT: Patient currently experiencing depression and anxiety. He recently separated from spouse due to difficulty managing mental health.   LCSW informed pt of difference between anger and aggression. Pt was successful in identifying triggers to anger. LCSW discussed strategies to assist in coping.     PLAN: 1. Follow up with behavioral health clinician on : Schedule follow up appointment 2. Behavioral recommendations: Utilize strategies discussed in  session 3. Referral(s): [facility] (In Clinic) 4. "From scale of 1-10, how likely are you to follow plan?": [address]  Rebekah Chesterfield, LCSW 03/04/2019 1:01 PM

## 2019-03-04 NOTE — Telephone Encounter (Signed)
A completed referral to [facility] for psychotherapy/medication management faxed to [phone] 

## 2019-03-10 NOTE — Progress Notes (Signed)
Office Visit Note  Patient: Jorge GlanceDarryl E Creelman Jr.             Date of Birth: 1986/06/08           MRN: [id number]             PCP: Bing NeighborsHarris, Kimberly S, FNP Referring: Hoy RegisterNewlin, Enobong, MD Visit Date: 03/24/2019 Occupation: Academic librarianackage handler at [employer]  Subjective:  Abnormal lab and joint stiffness.   History of Present Illness: Jorge E Criss AlvineJohnson Jr. is a 33 y.o. male seen in consultation per request of his PCP.  According to patient his symptoms are started in March 2020.  He states he was opening a bag of silverware when he suddenly noticed numbness in his left hand and tremors.  He went to the urgent care for initial evaluation and then he was referred to neurology.  He states he had thorough neurological work-up including lab work and MRI of his brain.  He states MRI of the brain was negative.  He had some abnormal labs and for that reason he was referred to me.  He states the numbness is better although it is infrequent.  He also was experiencing shaking on the left entire side of his body which is improved.  He complains of some joint stiffness which he has in his elbows, left wrist and his knee joints.  He denies any history of joint swelling.  There is no history of oral ulcers, nasal ulcers, malar rash, photosensitivity, Raynaud's, arthritis.  His brother was diagnosed with multiple sclerosis in April 2020.  There is positive history of systemic lupus in his maternal aunt.  Activities of Daily Living:  Patient reports morning stiffness for 5-10 minutes.   Patient Reports nocturnal pain.  Difficulty dressing/grooming: Denies Difficulty climbing stairs: Denies Difficulty getting out of chair: Denies Difficulty using hands for taps, buttons, cutlery, and/or writing: Reports  Review of Systems  Constitutional: Positive for fatigue. Negative for night sweats.  HENT: Negative for mouth sores, mouth dryness and nose dryness.   Eyes: Negative for redness, itching and dryness.  Respiratory:  Positive for shortness of breath. Negative for difficulty breathing.        Asthma  Cardiovascular: Negative for chest pain, palpitations, hypertension, irregular heartbeat and swelling in legs/feet.  Gastrointestinal: Negative for abdominal pain, blood in stool, constipation and diarrhea.  Endocrine: Negative for increased urination.  Genitourinary: Negative for difficulty urinating and painful urination.  Musculoskeletal: Positive for arthralgias, joint pain, myalgias, morning stiffness and myalgias. Negative for joint swelling, muscle weakness and muscle tenderness.  Skin: Negative for color change, rash, hair loss, nodules/bumps, skin tightness, ulcers and sensitivity to sunlight.  Allergic/Immunologic: Negative for susceptible to infections.  Neurological: Positive for numbness. Negative for dizziness, fainting, headaches, memory loss, night sweats and weakness.  Hematological: Negative for swollen glands.  Psychiatric/Behavioral: Positive for depressed mood. Negative for confusion and sleep disturbance. The patient is nervous/anxious.     PMFS History:  Patient Active Problem List   Diagnosis Date Noted   Anxiety and depression 03/24/2019   Family history of systemic lupus erythematosus 03/24/2019   Family history of multiple sclerosis 03/24/2019   Tremor 01/17/2019   Paresthesia 01/17/2019   SOB (shortness of breath) 07/24/2017    Past Medical History:  Diagnosis Date   Asthma 1994   Bronchitis 1997   Pneumonia 2013    Family History  Problem Relation Age of Onset   Hypertension Mother    Healthy Father    Hypertension Father  Healthy Sister    Multiple sclerosis Brother    Stroke Maternal Grandmother    Hypertension Maternal Grandmother    Hypertension Maternal Grandfather    COPD Paternal Grandmother    Asthma Paternal Grandmother    History reviewed. No pertinent surgical history. Social History   Social History Narrative   Lives at home  with his wife and daughter.   Right-handed.   One bottle of [employer] iced coffee.        There is no immunization history on file for this patient.   Objective: Vital Signs: BP 123/81 (BP Location: Right Arm, Patient Position: Sitting, Cuff Size: Normal)    Pulse 82    Resp 14    Ht 6\' 1"  (1.854 m)    Wt 133 lb 12.8 oz (60.7 kg)    BMI 17.65 kg/m    Physical Exam Vitals signs and nursing note reviewed.  Constitutional:      Appearance: He is well-developed.  HENT:     Head: Normocephalic and atraumatic.  Eyes:     Conjunctiva/sclera: Conjunctivae normal.     Pupils: Pupils are equal, round, and reactive to light.  Neck:     Musculoskeletal: Normal range of motion and neck supple.  Cardiovascular:     Rate and Rhythm: Normal rate and regular rhythm.     Heart sounds: Normal heart sounds.  Pulmonary:     Effort: Pulmonary effort is normal.     Breath sounds: Normal breath sounds.  Abdominal:     General: Bowel sounds are normal.     Palpations: Abdomen is soft.  Skin:    General: Skin is warm and dry.     Capillary Refill: Capillary refill takes less than 2 seconds.  Neurological:     Mental Status: He is alert and oriented to person, place, and time.  Psychiatric:        Behavior: Behavior normal.      Musculoskeletal Exam: C-spine, thoracic and lumbar spine with good range of motion.  He had no SI joint tenderness.  Shoulder joints, elbow joints, wrist joints, MCPs PIPs and DIPs with good range of motion with no synovitis.  Hip joints, knee joints, ankles, MTPs and PIPs been good range of motion with no synovitis.  CDAI Exam: CDAI Score: -- Patient Global: --; Provider Global: -- Swollen: --; Tender: -- Joint Exam   No joint exam has been documented for this visit   There is currently no information documented on the homunculus. Go to the Rheumatology activity and complete the homunculus joint exam.  Investigation: Findings:  01/17/19: ANA+, dsDNA 50, RNP-,  smith-, Ro-, La-, RPR-, Vitamin B12 936, TSH 1.29, CK 88, sed rate 3, CRP<1 02/03/19: ANA-, RF-, CCP 3, C3 94, C4 15, smooth muscle-, CH50 50, CRP<1  Component     Latest Ref Rng & Units 01/17/2019 02/03/2019 02/03/2019 02/03/2019         12:00 AM 12:00 AM 10:20 AM  dsDNA Ab     0 - 9 IU/mL 50 (H)   58 (H)  ENA RNP Ab     0.0 - 0.9 AI 0.2     ENA SM Ab Ser-aCnc     0.0 - 0.9 AI <0.2     ENA SSA (RO) Ab     0.0 - 0.9 AI <0.2     ENA SSB (LA) Ab     0.0 - 0.9 AI <0.2     SEE BELOW      Comment  ANA Titer 1         Negative  RA Latex Turbid.     0.0 - 13.9 IU/mL    <10.0  Cyclic Citrullin Peptide Ab     0 - 19 units    3  Complement C3, Serum     82 - 167 mg/dL    94  Complement C4, Serum     14 - 44 mg/dL    15  Anti Nuclear Antibody (ANA)     Negative Positive (A)     RPR     Non Reactive Non Reactive     Vitamin B12     232 - 1,245 pg/mL 936     TSH     0.450 - 4.500 uIU/mL 1.290     CK Total     49 - 439 U/L 88     Sed Rate     0 - 15 mm/hr 3     CRP     0 - 10 mg/L <1   <1  Smooth Muscle Ab     0 - 19 Units    6  Compl, Total (CH50)     >41 U/mL    50  specimen status report       Comment Comment    Imaging: No results found.  Recent Labs: Lab Results  Component Value Date   WBC 4.3 01/17/2019   HGB 13.1 01/17/2019   PLT 237 07/08/2017   NA 138 01/17/2019   K 5.0 01/17/2019   CL 104 01/17/2019   CO2 23 01/17/2019   GLUCOSE 91 01/17/2019   BUN 14 01/17/2019   CREATININE 0.98 01/17/2019   BILITOT 0.2 01/17/2019   ALKPHOS 50 01/17/2019   AST 14 01/17/2019   ALT 6 01/17/2019   PROT 6.9 01/17/2019   ALBUMIN 4.5 01/17/2019   CALCIUM 9.5 01/17/2019   GFRAA 117 01/17/2019    Speciality Comments: No specialty comments available.  Procedures:  No procedures performed Allergies: Apple   Assessment / Plan:     Visit Diagnoses: Positive double stranded DNA antibody test - 01/17/19: ANA+, dsDNA 50, RNP-, smith-, Ro-, La-, RPR-, Vitamin B12 936, TSH  1.29, CK 88, sed rate 3, CRP<17/30/20: ANA-, RF-, CCP 3, C3 94, C4 15, smooth muscle.  Patient was referred to me due to positive ANA and double-stranded DNA found on the lab work.  He has no clinical features of autoimmune disease.  There is positive history of systemic lupus in his maternal aunt.  It is not unusual to see antibodies positive in relatives of lupus patients.  I detailed discussion with the patient.  I will obtain completeAVISE labs.  He also had detailed discussion regarding the clinical features of autoimmune disease.  In case he develops any new symptoms he should notify me.  I also discussed the increased risk of developing autoimmune disease due to positive family history.  Paresthesia-he complains of paresthesias in his left hand.  I am uncertain if he had nerve conduction velocity done by neurology.  He has appointment coming up with neurology.  Tremor -he was evaluated by Dr. Debarah CrapeYen neurology.  MRI of his brain was negative.  He was quite anxious as his brother has been diagnosed with multiple sclerosis.  Anxiety and depression-patient states that he has tried medications and could not tolerate the side effects.  Family history of systemic lupus erythematosus - Maternal aunt  Family history of multiple sclerosis - Brother  Orders: No orders of the  defined types were placed in this encounter.  No orders of the defined types were placed in this encounter.   Face-to-face time spent with patient was 45 minutes. Greater than 50% of time was spent in counseling and coordination of care.  Follow-Up Instructions: Return for Positive ANA, positive double-stranded DNA.   Bo Merino, MD  Note - This record has been created using Editor, commissioning.  Chart creation errors have been sought, but may not always  have been located. Such creation errors do not reflect on  the standard of medical care.

## 2019-03-17 ENCOUNTER — Telehealth: Payer: Managed Care, Other (non HMO)

## 2019-03-24 ENCOUNTER — Ambulatory Visit (INDEPENDENT_AMBULATORY_CARE_PROVIDER_SITE_OTHER): Payer: Managed Care, Other (non HMO) | Admitting: Rheumatology

## 2019-03-24 ENCOUNTER — Other Ambulatory Visit: Payer: Self-pay

## 2019-03-24 ENCOUNTER — Encounter: Payer: Self-pay | Admitting: Rheumatology

## 2019-03-24 VITALS — BP 123/81 | HR 82 | Resp 14 | Ht 73.0 in | Wt 133.8 lb

## 2019-03-24 DIAGNOSIS — R251 Tremor, unspecified: Secondary | ICD-10-CM

## 2019-03-24 DIAGNOSIS — F32A Depression, unspecified: Secondary | ICD-10-CM

## 2019-03-24 DIAGNOSIS — F329 Major depressive disorder, single episode, unspecified: Secondary | ICD-10-CM | POA: Insufficient documentation

## 2019-03-24 DIAGNOSIS — R202 Paresthesia of skin: Secondary | ICD-10-CM

## 2019-03-24 DIAGNOSIS — Z820 Family history of epilepsy and other diseases of the nervous system: Secondary | ICD-10-CM | POA: Insufficient documentation

## 2019-03-24 DIAGNOSIS — F419 Anxiety disorder, unspecified: Secondary | ICD-10-CM

## 2019-03-24 DIAGNOSIS — Z8269 Family history of other diseases of the musculoskeletal system and connective tissue: Secondary | ICD-10-CM

## 2019-03-24 DIAGNOSIS — R768 Other specified abnormal immunological findings in serum: Secondary | ICD-10-CM | POA: Diagnosis not present

## 2019-03-28 ENCOUNTER — Encounter: Payer: Self-pay | Admitting: Rheumatology

## 2019-04-02 NOTE — Progress Notes (Signed)
Office Visit Note  Patient: Jorge Williams.             Date of Birth: 1986/01/13           MRN: [id number]             PCP: Bing Neighbors, FNP Referring: Bing Neighbors, FNP Visit Date: 04/14/2019 Occupation: @GUAROCC @  Subjective:  Fatigue, joint pain and joint swelling.   History of Present Illness: Jorge Williams. is a 33 y.o. male with history of joint pain and positive ANA.  He states he continues to have discomfort in his joints which she describes in his wrist joints, his hands, his right elbow and both knees.  He states he has noticed intermittent swelling in his wrist.  He also has morning stiffness.  He denies any history of oral ulcers, nasal ulcers, malar rash, photosensitivity, sicca symptoms.  He has been experiencing fatigue.  He states the numbness in his left hand has been off and on and not been bothersome recently.  He has appointment coming up with neurologist in November.  He states the tremors have improved.  Activities of Daily Living:  Patient reports morning stiffness for 30 minutes.   Patient Reports nocturnal pain.  Difficulty dressing/grooming: Denies Difficulty climbing stairs: Reports Difficulty getting out of chair: Denies Difficulty using hands for taps, buttons, cutlery, and/or writing: Denies  Review of Systems  Constitutional: Positive for fatigue. Negative for night sweats.  HENT: Positive for nose dryness. Negative for mouth sores and mouth dryness.   Eyes: Negative for redness, itching and dryness.  Respiratory: Negative for shortness of breath and difficulty breathing.   Cardiovascular: Negative for chest pain, palpitations, hypertension, irregular heartbeat and swelling in legs/feet.  Gastrointestinal: Negative for abdominal pain, constipation and diarrhea.  Endocrine: Negative for increased urination.  Genitourinary: Negative for difficulty urinating and painful urination.  Musculoskeletal: Positive for arthralgias, joint  pain and morning stiffness. Negative for joint swelling, myalgias, muscle weakness, muscle tenderness and myalgias.  Skin: Negative for color change, rash, hair loss, nodules/bumps, skin tightness, ulcers and sensitivity to sunlight.  Allergic/Immunologic: Negative for susceptible to infections.  Neurological: Positive for memory loss and weakness. Negative for dizziness, fainting, light-headedness, numbness, headaches and night sweats.  Hematological: Negative for bruising/bleeding tendency and swollen glands.  Psychiatric/Behavioral: Negative for depressed mood, confusion and sleep disturbance. The patient is not nervous/anxious.     PMFS History:  Patient Active Problem List   Diagnosis Date Noted  . Anxiety and depression 03/24/2019  . Family history of systemic lupus erythematosus 03/24/2019  . Family history of multiple sclerosis 03/24/2019  . Tremor 01/17/2019  . Paresthesia 01/17/2019  . SOB (shortness of breath) 07/24/2017    Past Medical History:  Diagnosis Date  . Asthma 1994  . Bronchitis 1997  . Pneumonia 2013    Family History  Problem Relation Age of Onset  . Hypertension Mother   . Healthy Father   . Hypertension Father   . Healthy Sister   . Multiple sclerosis Brother   . Stroke Maternal Grandmother   . Hypertension Maternal Grandmother   . Hypertension Maternal Grandfather   . COPD Paternal Grandmother   . Asthma Paternal Grandmother    History reviewed. No pertinent surgical history. Social History   Social History Narrative   Lives at home with his wife and daughter.   Right-handed.   One bottle of [employer] iced coffee.        There is no  immunization history on file for this patient.   Objective: Vital Signs: BP 110/70 (BP Location: Left Arm, Patient Position: Sitting, Cuff Size: Normal)   Pulse 67   Resp 14   Ht 6\' 1"  (1.854 m)   Wt 131 lb 12.8 oz (59.8 kg)   BMI 17.39 kg/m    Physical Exam Vitals signs and nursing note reviewed.   Constitutional:      Appearance: He is well-developed.  HENT:     Head: Normocephalic and atraumatic.  Eyes:     Conjunctiva/sclera: Conjunctivae normal.     Pupils: Pupils are equal, round, and reactive to light.  Neck:     Musculoskeletal: Normal range of motion and neck supple.  Cardiovascular:     Rate and Rhythm: Normal rate and regular rhythm.     Heart sounds: Normal heart sounds.  Pulmonary:     Effort: Pulmonary effort is normal.     Breath sounds: Normal breath sounds.  Abdominal:     General: Bowel sounds are normal.     Palpations: Abdomen is soft.  Skin:    General: Skin is warm and dry.     Capillary Refill: Capillary refill takes less than 2 seconds.  Neurological:     Mental Status: He is alert and oriented to person, place, and time.  Psychiatric:        Behavior: Behavior normal.      Musculoskeletal Exam: C-spine, thoracic and lumbar spine with good range of motion.  Shoulder joints elbow joints wrist joint MCPs PIPs DIPs been good range of motion.  He had tenderness on palpation of her left elbow and left wrist joint.  No obvious synovitis was noted.  He also had mild tenderness on palpation of his right wrist joint.  He is painful range of motion of his left knee joint with tenderness.  No synovitis was noted.  No joint effusion was noted.  CDAI Exam: CDAI Score: - Patient Global: -; Provider Global: - Swollen: -; Tender: - Joint Exam   No joint exam has been documented for this visit   There is currently no information documented on the homunculus. Go to the Rheumatology activity and complete the homunculus joint exam.  Investigation: No additional findings.  Imaging: No results found.  Recent Labs: Lab Results  Component Value Date   WBC 4.3 01/17/2019   HGB 13.1 01/17/2019   PLT 237 07/08/2017   NA 138 01/17/2019   K 5.0 01/17/2019   CL 104 01/17/2019   CO2 23 01/17/2019   GLUCOSE 91 01/17/2019   BUN 14 01/17/2019   CREATININE 0.98  01/17/2019   BILITOT 0.2 01/17/2019   ALKPHOS 50 01/17/2019   AST 14 01/17/2019   ALT 6 01/17/2019   PROT 6.9 01/17/2019   ALBUMIN 4.5 01/17/2019   CALCIUM 9.5 01/17/2019   GFRAA 117 01/17/2019  01/17/19: ANA+, dsDNA 50, RNP-, smith-, Ro-, La-, RPR-, Vitamin B12 936, TSH 1.29, CK 88, sed rate 3, CRP<1  02/03/19: ANA-, RF-, CCP 3, C3 94, C4 15, smooth muscle-, CH50 50, CRP<1  March 28, 2019 AVISE index -2.6, ANA +1: 160 speckled, dsDNA positive, (Smith, RNP, SSA, SSB, SCL 70, RNA polymerase, centromere, Jo 1-) CB Negative, anticardiolipin negative, beta-2 GP 1-, RF negative, anti-CCP negative, antithyroglobulin negative, antithyroid peroxidase negative  Speciality Comments: No specialty comments available.  Procedures:  No procedures performed Allergies: Apple   Assessment / Plan:     Visit Diagnoses: Autoimmune disease (HCC) - History of fatigue, arthralgias, intermittent  joint swelling, positive ANA and positive double-stranded DNA.  I do not have see any synovitis on examination but patient is a lot of discomfort.  His complements were normal.  I gave him the option of observing for right now or to start him on the treatment.  He wants to start on some form of treatment to get some relief.  Indications side effects contraindications of Plaquenil were discussed at length.  He was in agreement and wants to proceed with the medication.  Based on his weight we will start him on Plaquenil 200 mg p.o. twice daily Monday through Friday.  We will check labs in a month and then every 3 months to monitor for drug toxicity.  He has been advised to get baseline eye examination and then yearly eye examination.  High risk medication use - Plan: CBC with Differential/Platelet, COMPLETE METABOLIC PANEL WITH GFR  Paresthesia - History of paresthesias in his left hand.  Followed by neurolog Dr. Krista Blue.  Tremor - Followed by Dr. Krista Blue  Anxiety and depression  Family history of systemic lupus  erythematosus - maternal aunt  Family history of multiple sclerosis  Orders: Orders Placed This Encounter  Procedures  . CBC with Differential/Platelet  . COMPLETE METABOLIC PANEL WITH GFR   Meds ordered this encounter  Medications  . hydroxychloroquine (PLAQUENIL) 200 MG tablet    Sig: Take 1 tablet 200 mg BID Monday-Friday    Dispense:  120 tablet    Refill:  0    .  Follow-Up Instructions: Return in about 4 weeks (around 05/12/2019) for Autoimmune disease.   Bo Merino, MD  Note - This record has been created using Editor, commissioning.  Chart creation errors have been sought, but may not always  have been located. Such creation errors do not reflect on  the standard of medical care.

## 2019-04-07 ENCOUNTER — Ambulatory Visit (INDEPENDENT_AMBULATORY_CARE_PROVIDER_SITE_OTHER): Payer: Managed Care, Other (non HMO) | Admitting: Family Medicine

## 2019-04-07 DIAGNOSIS — R768 Other specified abnormal immunological findings in serum: Secondary | ICD-10-CM | POA: Diagnosis not present

## 2019-04-07 DIAGNOSIS — R251 Tremor, unspecified: Secondary | ICD-10-CM | POA: Diagnosis not present

## 2019-04-07 DIAGNOSIS — F329 Major depressive disorder, single episode, unspecified: Secondary | ICD-10-CM | POA: Diagnosis not present

## 2019-04-07 DIAGNOSIS — F419 Anxiety disorder, unspecified: Secondary | ICD-10-CM | POA: Diagnosis not present

## 2019-04-07 NOTE — Progress Notes (Signed)
Patient here for depression/anxiety follow up. Stopped taking Prozac & Hydroxyzine. States that they made him drowsy. Feels like his appointments with LCSW J. Lewis have been more helpful than the medications.   Would like to continue seeing her. Is ok on refills on his inhalers.  Saw Rheumatology a few weeks ago & was sent to have additional lab work done. He hasn't heard anything. Informed patient that he will need to reach out to their office.

## 2019-04-07 NOTE — Progress Notes (Signed)
Virtual Visit via Telephone Note  I connected with Jorge Williams., on 04/07/2019 at 9:26 AM by telephone due to the COVID-19 pandemic and verified that I am speaking with the correct person using two identifiers.   Consent: I discussed the limitations, risks, security and privacy concerns of performing an evaluation and management service by telephone and the availability of in person appointments. I also discussed with the patient that there may be a patient responsible charge related to this service. The patient expressed understanding and agreed to proceed.   Location of Patient: Home  Location of Provider: Clinic   Persons participating in Telemedicine visit: Lamberto E Sheliah Mends [facility] Dr. Felecia Shelling     History of Present Illness: 33 year old male with a history of Asthma , Anxiety here for a follow up visit.Commenced on Prozac and Hydroxyzine at his last visit. He complains Prozac and Hydroxyzine made him sedated and he discontinued them but therapy sessions with the LCSW have been more beneficial.  He also had complained of tremors for which he is being followed by Neurology and tremors are not as frequent in his hand and feet and he has not noticed it as much. He does occassionally have joint pains though. MRI of his brain had been unrevealing. He has a follow up with Neurology later this month. I had referred him to Rheumatology, Dr Estanislado Pandy due to positive ANA and double stranded DNA and he had a visit 2 weeks ago with additional blood work ordered the results of which he is yet to receive. Advised to give the Rheumatology office a call. Past Medical History:  Diagnosis Date  . Asthma 1994  . Bronchitis 1997  . Pneumonia 2013   Allergies  Allergen Reactions  . Apple Itching    Apples make the gums itch    Current Outpatient Medications on File Prior to Visit  Medication Sig Dispense Refill  . albuterol (VENTOLIN HFA) 108 (90 Base) MCG/ACT  inhaler Inhale 2 puffs into the lungs every 4 (four) hours as needed for wheezing or shortness of breath (cough, shortness of breath or wheezing.). 1 Inhaler 3  . fluticasone (FLOVENT HFA) 44 MCG/ACT inhaler Inhale 2 puffs twice daily 1 Inhaler 12   No current facility-administered medications on file prior to visit.     Observations/Objective: Alert, awake, oriented x3 Not in acute distress  Assessment and Plan: 1. Anxiety and depression Unable to tolerate Prozac and Hydroxyzine Psychotherapy has been beneficial Discontinue Prozac and Hydroxyzine  2. Elevated antinuclear antibody (ANA) level Unknown etiology Being worked up by Rheumatology  3. Tremor Improving MRI unrevealing Follow up with Neurology   Follow Up Instructions: Return in about 6 months (around 10/06/2019) for coordination of care.    I discussed the assessment and treatment plan with the patient. The patient was provided an opportunity to ask questions and all were answered. The patient agreed with the plan and demonstrated an understanding of the instructions.   The patient was advised to call back or seek an in-person evaluation if the symptoms worsen or if the condition fails to improve as anticipated.     I provided 11 minutes total of non-face-to-face time during this encounter including median intraservice time, reviewing previous notes, labs, imaging, medications, management and patient verbalized understanding.     Charlott Rakes, MD, FAAFP. [facility] and [place], [place]   04/07/2019, 9:26 AM

## 2019-04-14 ENCOUNTER — Encounter: Payer: Self-pay | Admitting: Rheumatology

## 2019-04-14 ENCOUNTER — Ambulatory Visit (INDEPENDENT_AMBULATORY_CARE_PROVIDER_SITE_OTHER): Payer: Managed Care, Other (non HMO) | Admitting: Rheumatology

## 2019-04-14 ENCOUNTER — Other Ambulatory Visit: Payer: Self-pay

## 2019-04-14 VITALS — BP 110/70 | HR 67 | Resp 14 | Ht 73.0 in | Wt 131.8 lb

## 2019-04-14 DIAGNOSIS — R251 Tremor, unspecified: Secondary | ICD-10-CM | POA: Diagnosis not present

## 2019-04-14 DIAGNOSIS — Z820 Family history of epilepsy and other diseases of the nervous system: Secondary | ICD-10-CM

## 2019-04-14 DIAGNOSIS — Z79899 Other long term (current) drug therapy: Secondary | ICD-10-CM | POA: Diagnosis not present

## 2019-04-14 DIAGNOSIS — F329 Major depressive disorder, single episode, unspecified: Secondary | ICD-10-CM

## 2019-04-14 DIAGNOSIS — R202 Paresthesia of skin: Secondary | ICD-10-CM

## 2019-04-14 DIAGNOSIS — Z8269 Family history of other diseases of the musculoskeletal system and connective tissue: Secondary | ICD-10-CM

## 2019-04-14 DIAGNOSIS — M359 Systemic involvement of connective tissue, unspecified: Secondary | ICD-10-CM

## 2019-04-14 DIAGNOSIS — F419 Anxiety disorder, unspecified: Secondary | ICD-10-CM

## 2019-04-14 MED ORDER — HYDROXYCHLOROQUINE SULFATE 200 MG PO TABS: ORAL_TABLET | ORAL | 0 refills | Status: DC

## 2019-04-14 NOTE — Progress Notes (Signed)
Pharmacy Note  Subjective: Patient presents today to the [facility] to see Dr. Estanislado Pandy.  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  He is naive to therapy.  Objective: CMP     Component Value Date/Time   NA 138 01/17/2019 0941   K 5.0 01/17/2019 0941   CL 104 01/17/2019 0941   CO2 23 01/17/2019 0941   GLUCOSE 91 01/17/2019 0941   GLUCOSE 108 (H) 07/08/2017 1627   BUN 14 01/17/2019 0941   CREATININE 0.98 01/17/2019 0941   CALCIUM 9.5 01/17/2019 0941   PROT 6.9 01/17/2019 0941   ALBUMIN 4.5 01/17/2019 0941   AST 14 01/17/2019 0941   ALT 6 01/17/2019 0941   ALKPHOS 50 01/17/2019 0941   BILITOT 0.2 01/17/2019 0941   GFRNONAA 101 01/17/2019 0941   GFRAA 117 01/17/2019 0941    CBC    Component Value Date/Time   WBC 4.3 01/17/2019 0941   WBC 6.4 07/08/2017 1627   RBC 4.29 01/17/2019 0941   RBC 4.31 07/08/2017 1627   HGB 13.1 01/17/2019 0941   HCT 39.0 01/17/2019 0941   PLT 237 07/08/2017 1627   MCV 91 01/17/2019 0941   MCH 30.5 01/17/2019 0941   MCH 29.0 07/08/2017 1627   MCHC 33.6 01/17/2019 0941   MCHC 33.3 07/08/2017 1627   RDW 13.2 01/17/2019 0941   LYMPHSABS 2.3 01/17/2019 0941   MONOABS 0.4 06/30/2008 0231   EOSABS 0.2 01/17/2019 0941   BASOSABS 0.0 01/17/2019 0941    Assessment/Plan: Patient was counseled on the purpose, proper use, and adverse effects of hydroxychloroquine including nausea/diarrhea, skin rash, headaches, and sun sensitivity.  Discussed importance of annual eye exams while on hydroxychloroquine to monitor to ocular toxicity and discussed importance of frequent laboratory monitoring.  Provided patient with eye exam form for baseline ophthalmologic exam and standing lab instructions.  Provided patient with educational materials on hydroxychloroquine and answered all questions.  Patient consented to hydroxychloroquine.  Will upload consent in the media tab.    Dose will be Plaquenil 200  mg twice daily Monday through Friday.    All questions encouraged and answered.  Instructed patient to call with any other questions or concerns.  Mariella Saa, PharmD, [place], [place] [facility] Pharmacist [phone]  04/14/2019 2:51 PM

## 2019-04-14 NOTE — Patient Instructions (Signed)
Standing Labs We placed an order today for your standing lab work.    Please come back and get your standing labs in 1 month, 3 months, then every 5 months   We have open lab daily Monday through Thursday from 8:30-12:30 PM and 1:30-4:30 PM and Friday from 8:30-12:30 PM and 1:30-4:00 PM at the office of Dr. Shaili Deveshwar.   You may experience shorter wait times on Monday and Friday afternoons. The office is located at [address], [address], [place], [place] [address] No appointment is necessary.   Labs are drawn by [facility].  You may receive a bill from [facility] for your lab work.  If you wish to have your labs drawn at another location, please call the office 24 hours in advance to send orders.  If you have any questions regarding directions or hours of operation,  please call [phone].   Just as a reminder please drink plenty of water prior to coming for your lab work. Thanks!   Hydroxychloroquine tablets What is this medicine? HYDROXYCHLOROQUINE (hye drox ee KLOR oh kwin) is used to treat rheumatoid arthritis and systemic lupus erythematosus. It is also used to treat malaria. This medicine may be used for other purposes; ask your health care provider or pharmacist if you have questions. COMMON BRAND NAME(S): Plaquenil, Quineprox What should I tell my health care provider before I take this medicine? They need to know if you have any of these conditions:  diabetes  eye disease, vision problems  G6PD deficiency  heart disease  history of irregular heartbeat  if you often drink alcohol  kidney disease  liver disease  porphyria  psoriasis  an unusual or allergic reaction to chloroquine, hydroxychloroquine, other medicines, foods, dyes, or preservatives  pregnant or trying to get pregnant  breast-feeding How should I use this medicine? Take this medicine by mouth with a glass of water. Follow the directions on the prescription label. Do not cut, crush or  chew this medicine. Swallow the tablets whole. Take this medicine with food. Avoid taking antacids within 4 hours of taking this medicine. It is best to separate these medicines by at least 4 hours. Take your medicine at regular intervals. Do not take it more often than directed. Take all of your medicine as directed even if you think you are better. Do not skip doses or stop your medicine early. Talk to your pediatrician regarding the use of this medicine in children. While this drug may be prescribed for selected conditions, precautions do apply. Overdosage: If you think you have taken too much of this medicine contact a poison control center or emergency room at once. NOTE: This medicine is only for you. Do not share this medicine with others. What if I miss a dose? If you miss a dose, take it as soon as you can. If it is almost time for your next dose, take only that dose. Do not take double or extra doses. What may interact with this medicine? Do not take this medicine with any of the following medications:  cisapride  dronedarone  pimozide  thioridazine This medicine may also interact with the following medications:  ampicillin  antacids  cimetidine  cyclosporine  digoxin  kaolin  medicines for diabetes, like insulin, glipizide, glyburide  medicines for seizures like carbamazepine, phenobarbital, phenytoin  mefloquine  methotrexate  other medicines that prolong the QT interval (cause an abnormal heart rhythm)  praziquantel This list may not describe all possible interactions. Give your health care provider   a list of all the medicines, herbs, non-prescription drugs, or dietary supplements you use. Also tell them if you smoke, drink alcohol, or use illegal drugs. Some items may interact with your medicine. What should I watch for while using this medicine? Visit your health care professional for regular checks on your progress. Tell your health care professional if your  symptoms do not start to get better or if they get worse. You may need blood work done while you are taking this medicine. If you take other medicines that can affect heart rhythm, you may need more testing. Talk to your health care professional if you have questions. Your vision may be tested before and during use of this medicine. Tell your health care professional right away if you have any change in your eyesight. What side effects may I notice from receiving this medicine? Side effects that you should report to your doctor or health care professional as soon as possible:  allergic reactions like skin rash, itching or hives, swelling of the face, lips, or tongue  changes in vision  decreased hearing or ringing of the ears  muscle weakness  redness, blistering, peeling or loosening of the skin, including inside the mouth  sensitivity to light  signs and symptoms of a dangerous change in heartbeat or heart rhythm like chest pain; dizziness; fast or irregular heartbeat; palpitations; feeling faint or lightheaded, falls; breathing problems  signs and symptoms of liver injury like dark yellow or brown urine; general ill feeling or flu-like symptoms; light-colored stools; loss of appetite; nausea; right upper belly pain; unusually weak or tired; yellowing of the eyes or skin  signs and symptoms of low blood sugar such as feeling anxious; confusion; dizziness; increased hunger; unusually weak or tired; sweating; shakiness; cold; irritable; headache; blurred vision; fast heartbeat; loss of consciousness  suicidal thoughts  uncontrollable head, mouth, neck, arm, or leg movements Side effects that usually do not require medical attention (report to your doctor or health care professional if they continue or are bothersome):  diarrhea  dizziness  hair loss  headache  irritable  loss of appetite  nausea, vomiting  stomach pain This list may not describe all possible side effects.  Call your doctor for medical advice about side effects. You may report side effects to FDA at 1-800-FDA-1088. Where should I keep my medicine? Keep out of the reach of children. Store at room temperature between 15 and 30 degrees C (59 and 86 degrees F). Protect from moisture and light. Throw away any unused medicine after the expiration date. NOTE: This sheet is a summary. It may not cover all possible information. If you have questions about this medicine, talk to your doctor, pharmacist, or health care provider.  2020 Elsevier/Gold Standard (2018-11-01 12:56:32)  

## 2019-04-19 ENCOUNTER — Ambulatory Visit: Payer: Managed Care, Other (non HMO) | Admitting: Neurology

## 2019-04-28 NOTE — Progress Notes (Deleted)
Office Visit Note  Patient: Jorge Williams.             Date of Birth: 07/09/1985           MRN: [id number]             PCP: Scot Jun, FNP Referring: Scot Jun, FNP Visit Date: 05/12/2019 Occupation: @GUAROCC @  Subjective:  No chief complaint on file.   History of Present Illness: Jorge Williams. is a 33 y.o. male ***   Activities of Daily Living:  Patient reports morning stiffness for *** {minute/hour:19697}.   Patient {ACTIONS;DENIES/REPORTS:21021675::"Denies"} nocturnal pain.  Difficulty dressing/grooming: {ACTIONS;DENIES/REPORTS:21021675::"Denies"} Difficulty climbing stairs: {ACTIONS;DENIES/REPORTS:21021675::"Denies"} Difficulty getting out of chair: {ACTIONS;DENIES/REPORTS:21021675::"Denies"} Difficulty using hands for taps, buttons, cutlery, and/or writing: {ACTIONS;DENIES/REPORTS:21021675::"Denies"}  No Rheumatology ROS completed.   PMFS History:  Patient Active Problem List   Diagnosis Date Noted  . Anxiety and depression 03/24/2019  . Family history of systemic lupus erythematosus 03/24/2019  . Family history of multiple sclerosis 03/24/2019  . Tremor 01/17/2019  . Paresthesia 01/17/2019  . SOB (shortness of breath) 07/24/2017    Past Medical History:  Diagnosis Date  . Asthma 1994  . Bronchitis 1997  . Pneumonia 2013    Family History  Problem Relation Age of Onset  . Hypertension Mother   . Healthy Father   . Hypertension Father   . Healthy Sister   . Multiple sclerosis Brother   . Stroke Maternal Grandmother   . Hypertension Maternal Grandmother   . Hypertension Maternal Grandfather   . COPD Paternal Grandmother   . Asthma Paternal Grandmother    No past surgical history on file. Social History   Social History Narrative   Lives at home with his wife and daughter.   Right-handed.   One bottle of [employer] iced coffee.        There is no immunization history on file for this patient.   Objective: Vital  Signs: There were no vitals taken for this visit.   Physical Exam   Musculoskeletal Exam: ***  CDAI Exam: CDAI Score: - Patient Global: -; Provider Global: - Swollen: -; Tender: - Joint Exam   No joint exam has been documented for this visit   There is currently no information documented on the homunculus. Go to the Rheumatology activity and complete the homunculus joint exam.  Investigation: No additional findings.  Imaging: No results found.  Recent Labs: Lab Results  Component Value Date   WBC 4.3 01/17/2019   HGB 13.1 01/17/2019   PLT 237 07/08/2017   NA 138 01/17/2019   K 5.0 01/17/2019   CL 104 01/17/2019   CO2 23 01/17/2019   GLUCOSE 91 01/17/2019   BUN 14 01/17/2019   CREATININE 0.98 01/17/2019   BILITOT 0.2 01/17/2019   ALKPHOS 50 01/17/2019   AST 14 01/17/2019   ALT 6 01/17/2019   PROT 6.9 01/17/2019   ALBUMIN 4.5 01/17/2019   CALCIUM 9.5 01/17/2019   GFRAA 117 01/17/2019    Speciality Comments: No specialty comments available.  Procedures:  No procedures performed Allergies: Apple   Assessment / Plan:     Visit Diagnoses: No diagnosis found.  Orders: No orders of the defined types were placed in this encounter.  No orders of the defined types were placed in this encounter.   Face-to-face time spent with patient was *** minutes. Greater than 50% of time was spent in counseling and coordination of care.  Follow-Up Instructions: No follow-ups on file.  Earnestine Mealing, CMA  Note - This record has been created using Editor, commissioning.  Chart creation errors have been sought, but may not always  have been located. Such creation errors do not reflect on  the standard of medical care.

## 2019-05-11 ENCOUNTER — Ambulatory Visit: Payer: Managed Care, Other (non HMO) | Admitting: Neurology

## 2019-05-12 ENCOUNTER — Ambulatory Visit: Payer: Managed Care, Other (non HMO) | Admitting: Rheumatology

## 2019-06-23 IMAGING — DX DG CHEST 2V
2 series · 2 of 2 positions shown · non-contrast
Comparison: 05/09/2015

CLINICAL DATA: Shortness of breath.  History of asthma.

EXAM:
CHEST  2 VIEW

[chest pa]
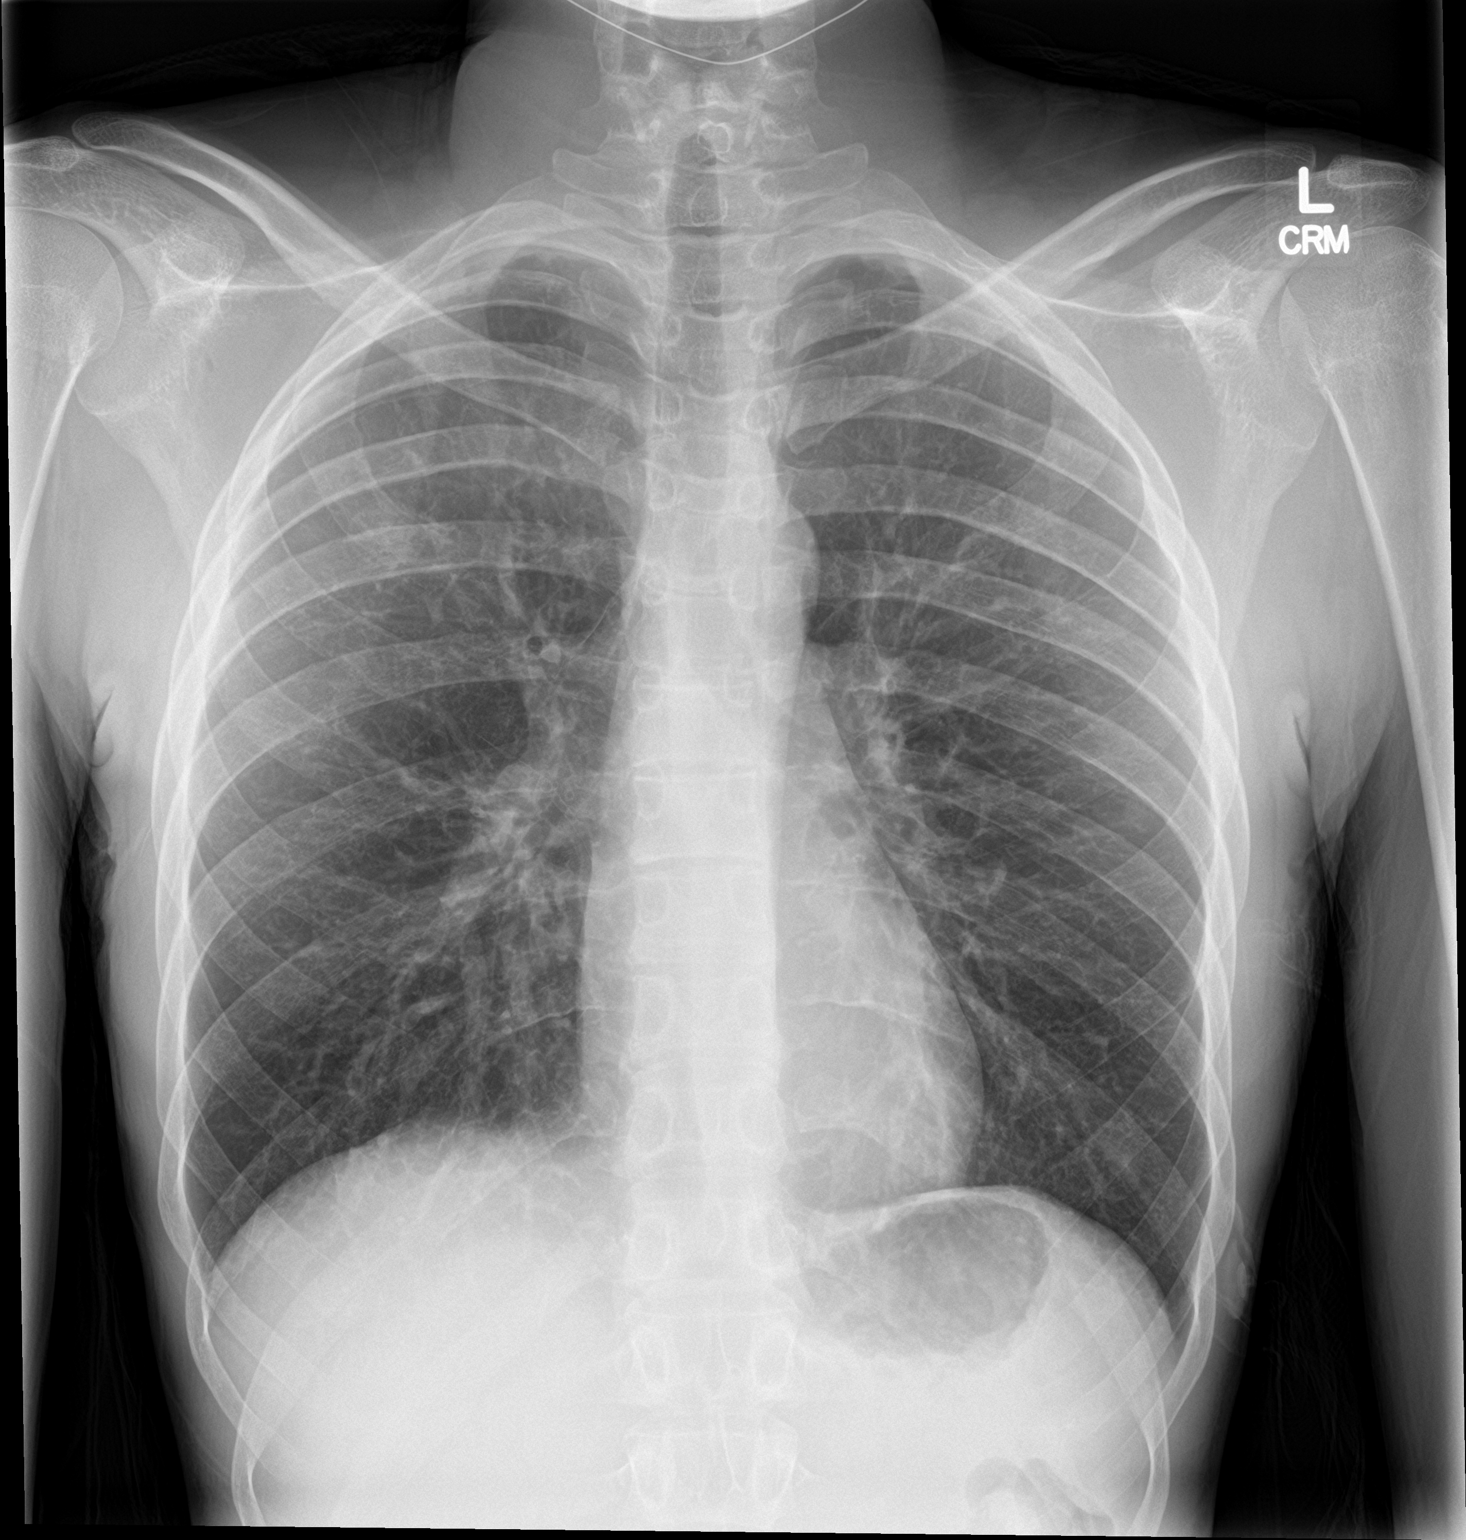

[chest lat]
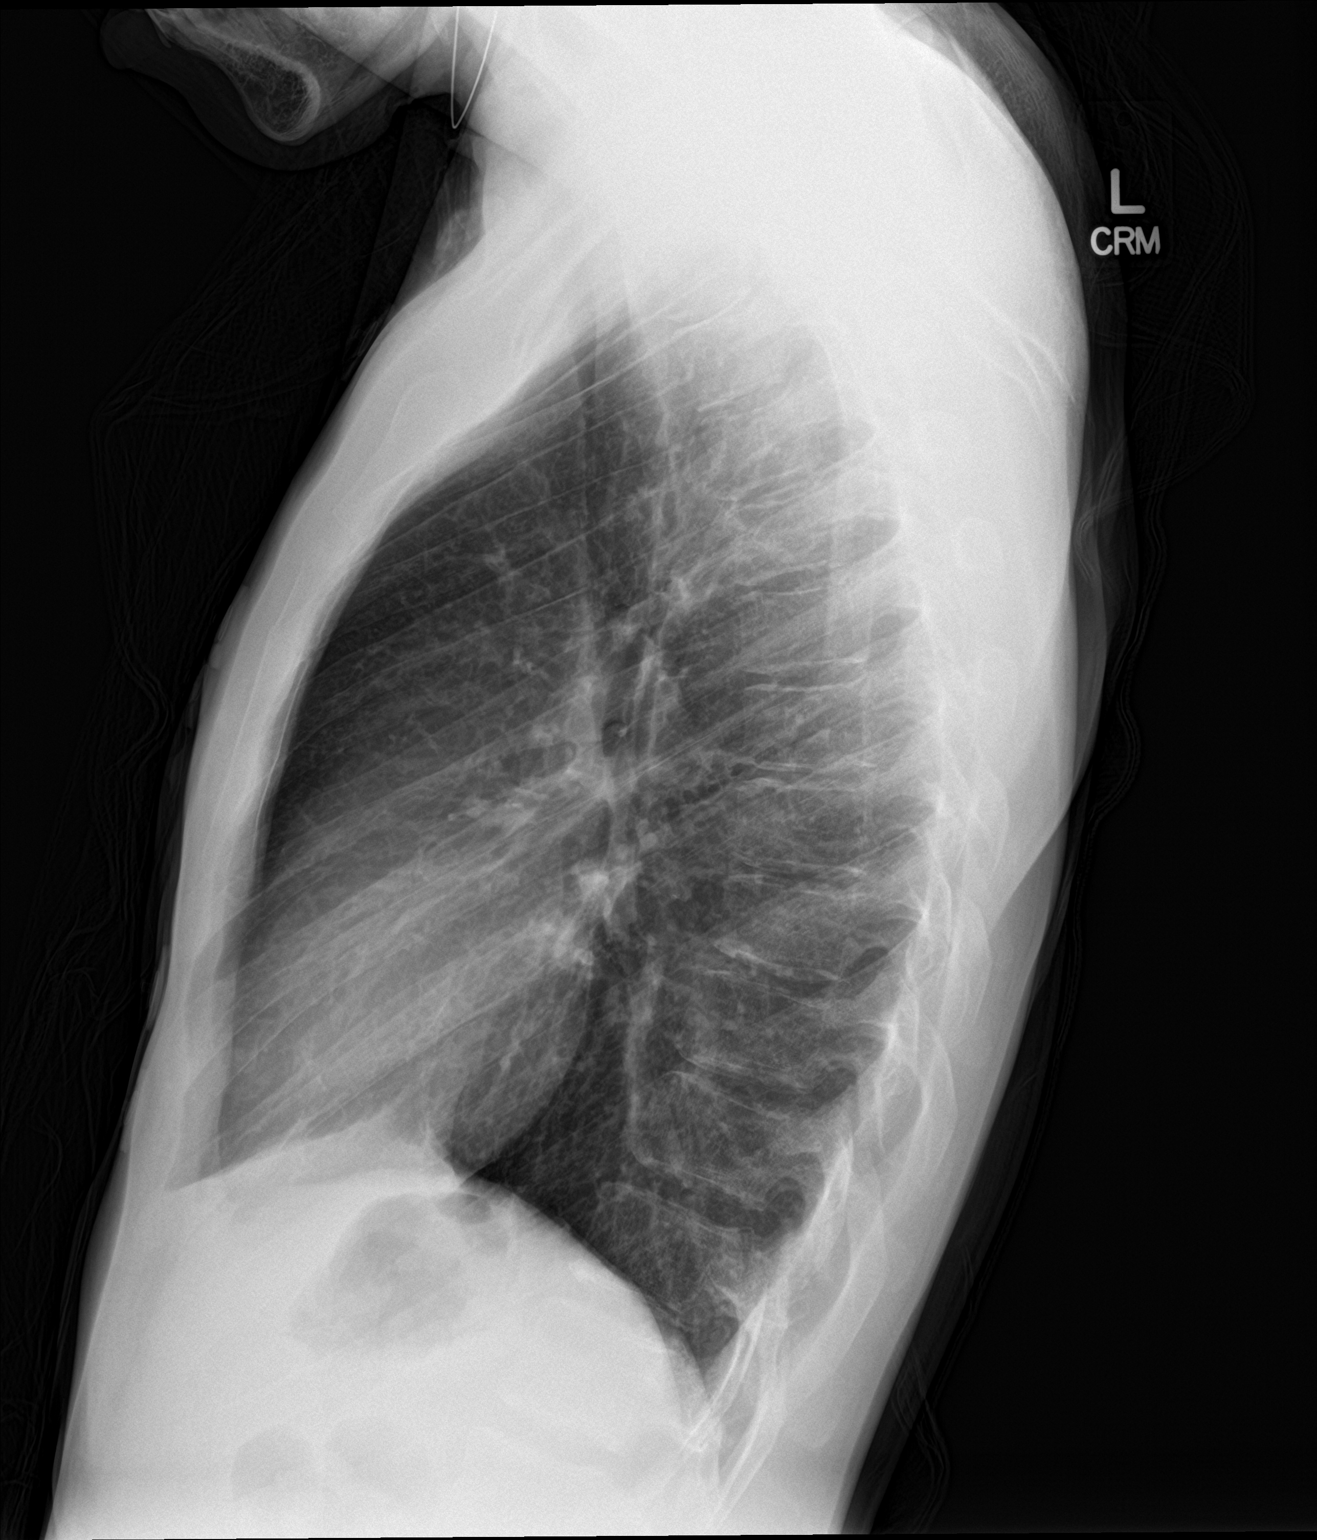

[2 of 2 positions shown; findings below may reference images not displayed]

FINDINGS: The cardiomediastinal silhouette is within normal limits. The lungs
are well inflated with possible mild chronic peribronchial
thickening. No confluent airspace opacity, edema, pleural effusion,
or pneumothorax is identified. No acute osseous abnormality is seen.
IMPRESSION: No evidence of active cardiopulmonary disease.

## 2019-11-08 ENCOUNTER — Ambulatory Visit (INDEPENDENT_AMBULATORY_CARE_PROVIDER_SITE_OTHER): Payer: Self-pay | Admitting: Licensed Clinical Social Worker

## 2019-11-08 DIAGNOSIS — F331 Major depressive disorder, recurrent, moderate: Secondary | ICD-10-CM

## 2019-11-09 ENCOUNTER — Encounter: Payer: Self-pay | Admitting: Internal Medicine

## 2019-11-09 ENCOUNTER — Telehealth (INDEPENDENT_AMBULATORY_CARE_PROVIDER_SITE_OTHER): Payer: Self-pay | Admitting: Internal Medicine

## 2019-11-09 ENCOUNTER — Telehealth: Payer: Self-pay | Admitting: Internal Medicine

## 2019-11-09 DIAGNOSIS — Z5989 Other problems related to housing and economic circumstances: Secondary | ICD-10-CM

## 2019-11-09 DIAGNOSIS — J4530 Mild persistent asthma, uncomplicated: Secondary | ICD-10-CM

## 2019-11-09 DIAGNOSIS — Z598 Other problems related to housing and economic circumstances: Secondary | ICD-10-CM

## 2019-11-09 MED ORDER — ALBUTEROL SULFATE HFA 108 (90 BASE) MCG/ACT IN AERS: 2.0000 | INHALATION_SPRAY | RESPIRATORY_TRACT | 5 refills | Status: DC | PRN

## 2019-11-09 MED ORDER — FLOVENT HFA 44 MCG/ACT IN AERO: INHALATION_SPRAY | RESPIRATORY_TRACT | 12 refills | Status: DC

## 2019-11-09 MED FILL — !VENTOLIN HFA INHALER: 108 (90 BAS | 16 days supply | Qty: 18 | Fill #0

## 2019-11-09 MED FILL — FLOVENT HFA 44 MCG INHALER: 44 | 30 days supply | Qty: 11 | Fill #0

## 2019-11-09 NOTE — Progress Notes (Signed)
Virtual Visit via Telephone Note  I connected with Jorge Williams., on 11/09/2019 at 10:24 AM by telephone due to the COVID-19 pandemic and verified that I am speaking with the correct person using two identifiers.   Consent: I discussed the limitations, risks, security and privacy concerns of performing an evaluation and management service by telephone and the availability of in person appointments. I also discussed with the patient that there may be a patient responsible charge related to this service. The patient expressed understanding and agreed to proceed.   Location of Patient: Home   Location of Provider: Clinic    Persons participating in Telemedicine visit: Darrian E Annamaria [facility] Dr. Earlene Plater    History of Present Illness: Patient has a visit to follow up on asthma. He reports that he forgot to call to make an appointment because it kept slipping his mind. He is now out of his Albuterol inhaler. He reports some increase in breathing troubles he attributes to the pollen. He reports he needs to use Albuterol about once per week typically prior to bedtime. Nighttime awakenings <1 per week.    Past Medical History:  Diagnosis Date  . Asthma 1994  . Bronchitis 1997  . Pneumonia 2013   Allergies  Allergen Reactions  . Apple Itching    Apples make the gums itch    Current Outpatient Medications on File Prior to Visit  Medication Sig Dispense Refill  . albuterol (VENTOLIN HFA) 108 (90 Base) MCG/ACT inhaler Inhale 2 puffs into the lungs every 4 (four) hours as needed for wheezing or shortness of breath (cough, shortness of breath or wheezing.). 1 Inhaler 3  . fluticasone (FLOVENT HFA) 44 MCG/ACT inhaler Inhale 2 puffs twice daily 1 Inhaler 12  . hydroxychloroquine (PLAQUENIL) 200 MG tablet Take 1 tablet 200 mg BID Monday-Friday 120 tablet 0   No current facility-administered medications on file prior to visit.    Observations/Objective: NAD. Speaking  clearly.  Work of breathing normal.  Alert and oriented. Mood appropriate.   Assessment and Plan: 1. Mild persistent asthma without complication No signs of exacerbation. Will refill medications. Recommended addition of anti-histamine to help with seasonal allergy symptoms as can trigger asthma.  - albuterol (VENTOLIN HFA) 108 (90 Base) MCG/ACT inhaler; Inhale 2 puffs into the lungs every 4 (four) hours as needed for wheezing or shortness of breath (cough, shortness of breath or wheezing.).  Dispense: 18 g; Refill: 5 - fluticasone (FLOVENT HFA) 44 MCG/ACT inhaler; Inhale 2 puffs twice daily  Dispense: 1 Inhaler; Refill: 12  2. Does not have health insurance Sent Rx to [facility] pharmacy due to lack of insurance.    Follow Up Instructions: Annual exam and routine medical care    I discussed the assessment and treatment plan with the patient. The patient was provided an opportunity to ask questions and all were answered. The patient agreed with the plan and demonstrated an understanding of the instructions.   The patient was advised to call back or seek an in-person evaluation if the symptoms worsen or if the condition fails to improve as anticipated.     I provided 12 minutes total of non-face-to-face time during this encounter including median intraservice time, reviewing previous notes, investigations, ordering medications, medical decision making, coordinating care and patient verbalized understanding at the end of the visit.    Marcy Siren, D.O. [facility]  11/09/2019, 10:24 AM

## 2019-11-16 ENCOUNTER — Ambulatory Visit: Payer: Self-pay | Attending: Family Medicine | Admitting: Licensed Clinical Social Worker

## 2019-11-16 ENCOUNTER — Other Ambulatory Visit: Payer: Self-pay

## 2019-11-16 DIAGNOSIS — F331 Major depressive disorder, recurrent, moderate: Secondary | ICD-10-CM

## 2019-11-30 ENCOUNTER — Ambulatory Visit: Payer: Self-pay | Admitting: Licensed Clinical Social Worker

## 2019-12-04 NOTE — BH Specialist Note (Signed)
Integrated Behavioral Health Visit via Telemedicine (Telephone)  11/08/2019  Jorge Williams [id number]   Session Start time: 1:45 PM  Session End time: 2:05 PM Total time: 20  Referring Provider: NP Tiburcio Pea Type of Visit: Telephonic Patient location: Home  [facility] Provider location: Office All persons participating in visit: LCSW and patient  Confirmed patient's address: Yes  Confirmed patient's phone number: Yes  Any changes to demographics: No   Confirmed patient's insurance: Yes  Any changes to patient's insurance: No   Discussed confidentiality: Yes    The following statements were read to the patient and/or legal guardian that are established with the [facility] Provider.  "The purpose of this phone visit is to provide behavioral health care while limiting exposure to the coronavirus (COVID19).  There is a possibility of technology failure and discussed alternative modes of communication if that failure occurs."  "By engaging in this telephone visit, you consent to the provision of healthcare.  Additionally, you authorize for your insurance to be billed for the services provided during this telephone visit."   Patient and/or legal guardian consented to telephone visit: Yes   PRESENTING CONCERNS: Patient and/or family reports the following symptoms/concerns: Pt reports interest in re-initiating mental health services to assist in management of depression and anxiety symptoms Duration of problem: Ongoing; Severity of problem: moderate  STRENGTHS (Protective Factors/Coping Skills): Pt has desire to change Pt has good insight  GOALS ADDRESSED: Patient will: 1.  Reduce symptoms of: agitation, anxiety and depression  2.  Increase knowledge and/or ability of: coping skills and healthy habits  3.  Demonstrate ability to: Increase healthy adjustment to current life circumstances and Increase adequate support systems for patient/family  INTERVENTIONS: Interventions  utilized:  Solution-Focused Strategies and Supportive Counseling Standardized Assessments completed: Not Needed  ASSESSMENT: Patient currently experiencing symptoms of anxiety and depression.   Patient may benefit from continued psychotherapy to assist with management of symptoms.  PLAN: 1. Follow up with behavioral health clinician on : 11/16/2019 2. Behavioral recommendations: Utilize strategies discussed 3. Referral(s): Integrated [employer] (In Clinic)  Bridgett Larsson, [place] 12/04/2019 PM 12:39 PM

## 2019-12-23 NOTE — BH Specialist Note (Signed)
Integrated Behavioral Health Follow Up Visit  MRN: [id number] Name: Jorge Williams.  Number of Integrated Behavioral Health Clinician visits: 3/6 Session Start time: 3:15 PM  Session End time: 3:45 PM Total time: 30  Type of Service: Integrated Behavioral Health- Individual Interpretor:No. Interpretor Name and Language: NA  SUBJECTIVE: Jorge Williams. is a 34 y.o. male accompanied by Self Patient was referred by Dr. Earlene Plater for stress. Patient reports the following symptoms/concerns: Patient reports difficulty managing depression anxiety symptoms triggered by psychosocial stressors.  Patient reports difficulty managing anger Duration of problem: Ongoing; Severity of problem: moderate  OBJECTIVE: Mood: Appropriate and Affect: Appropriate Risk of harm to self or others: No plan to harm self or others  LIFE CONTEXT: Family and Social: She reports limited support School/Work: Patient is employed Self-Care: Patient is participating in brief therapy Life Changes: Patient is experiencing psychosocial stressors  GOALS ADDRESSED: Patient will: 1.  Reduce symptoms of: agitation, anxiety and depression  2.  Increase knowledge and/or ability of: self-management skills  3.  Demonstrate ability to: Increase healthy adjustment to current life circumstances and Increase adequate support systems for patient/family  INTERVENTIONS: Interventions utilized:  Solution-Focused Strategies, Supportive Counseling and Psychoeducation and/or Health Education Standardized Assessments completed: GAD-7 and PHQ 2&9 with C-SSRS patient scored positive on PHQ-9; however denies current suicidal ideations.  Patient identified protective factors and is aware of local crisis intervention resources  ASSESSMENT: Patient currently experiencing psychosocial stressors.   Patient may benefit from continued brief therapy.  Strategies to assist in management of anger discussed  PLAN: 1. Follow up with  behavioral health clinician on : 11/30/2019 2. Behavioral recommendations: Utilize strategies discussed 3. Referral(s): Integrated Behavioral Health Services (In Clinic) 4. "From scale of 1-10, how likely are you to follow plan?":   Bridgett Larsson, LCSW  12/23/2019 5:57 PM

## 2020-02-11 ENCOUNTER — Ambulatory Visit
Admission: EM | Admit: 2020-02-11 | Discharge: 2020-02-11 | Disposition: A | Discharge status: Home / Self Care | Payer: Self-pay | Attending: Physician Assistant | Admitting: Physician Assistant

## 2020-02-11 ENCOUNTER — Encounter: Payer: Self-pay | Admitting: Emergency Medicine

## 2020-02-11 ENCOUNTER — Other Ambulatory Visit: Payer: Self-pay

## 2020-02-11 DIAGNOSIS — J4521 Mild intermittent asthma with (acute) exacerbation: Secondary | ICD-10-CM

## 2020-02-11 DIAGNOSIS — R0981 Nasal congestion: Secondary | ICD-10-CM

## 2020-02-11 DIAGNOSIS — Z1152 Encounter for screening for COVID-19: Secondary | ICD-10-CM

## 2020-02-11 DIAGNOSIS — R059 Cough, unspecified: Secondary | ICD-10-CM

## 2020-02-11 MED ORDER — ALBUTEROL SULFATE (2.5 MG/3ML) 0.083% IN NEBU: 2.5000 mg | INHALATION_SOLUTION | Freq: Four times a day (QID) | RESPIRATORY_TRACT | 0 refills | Status: DC | PRN

## 2020-02-11 MED ORDER — FLUTICASONE PROPIONATE 50 MCG/ACT NA SUSP: 2.0000 | Freq: Every day | NASAL | 0 refills | Status: DC

## 2020-02-11 NOTE — ED Provider Notes (Signed)
[facility]    CSN: [id number] Arrival date & time: 02/11/20  1518      History   Chief Complaint Chief Complaint  Patient presents with  . URI  . Asthma    HPI Jorge Williams. is a 34 y.o. male.   34 year old male comes in for 2 day of URI symptoms. Cough, nasal congestion, sore throat, rhinorrhea. Denies fever, chills, body aches. Denies abdominal pain, nausea, vomiting, diarrhea. Shortness of breath with relief using albuterol. Denies  loss of taste/smell. Current every day smoker, 3-4 cigs/day.     Past Medical History:  Diagnosis Date  . Asthma 1994  . Bronchitis 1997  . Pneumonia 2013    Patient Active Problem List   Diagnosis Date Noted  . Anxiety and depression 03/24/2019  . Family history of systemic lupus erythematosus 03/24/2019  . Family history of multiple sclerosis 03/24/2019  . Tremor 01/17/2019  . Paresthesia 01/17/2019  . SOB (shortness of breath) 07/24/2017    History reviewed. No pertinent surgical history.     Home Medications    Prior to Admission medications   Medication Sig Start Date End Date Taking? Authorizing Provider  albuterol (PROVENTIL) (2.5 MG/3ML) 0.083% nebulizer solution Take 3 mLs (2.5 mg total) by nebulization every 6 (six) hours as needed for wheezing or shortness of breath. 02/11/20   Cathie Hoops, [place] Code V, PA-C  albuterol (VENTOLIN HFA) 108 (90 Base) MCG/ACT inhaler Inhale 2 puffs into the lungs every 4 (four) hours as needed for wheezing or shortness of breath (cough, shortness of breath or wheezing.). 11/09/19   Arvilla Market, DO  fluticasone (FLONASE) 50 MCG/ACT nasal spray Place 2 sprays into both nostrils daily. 02/11/20   Belinda Fisher, PA-C  fluticasone (FLOVENT HFA) 44 MCG/ACT inhaler Inhale 2 puffs twice daily 11/09/19   Arvilla Market, DO  hydroxychloroquine (PLAQUENIL) 200 MG tablet Take 1 tablet 200 mg BID Monday-Friday Patient not taking: Reported on 02/11/2020 04/14/19   Pollyann Savoy, MD     Family History Family History  Problem Relation Age of Onset  . Hypertension Mother   . Healthy Father   . Hypertension Father   . Healthy Sister   . Multiple sclerosis Brother   . Stroke Maternal Grandmother   . Hypertension Maternal Grandmother   . Hypertension Maternal Grandfather   . COPD Paternal Grandmother   . Asthma Paternal Grandmother     Social History Social History   Tobacco Use  . Smoking status: Former Smoker    Types: Cigars  . Smokeless tobacco: Never Used  Vaping Use  . Vaping Use: Never used  Substance Use Topics  . Alcohol use: Yes    Comment: occ  . Drug use: Yes    Frequency: 7.0 times per week    Types: Marijuana    Comment: daily     Allergies   Apple   Review of Systems Review of Systems  Reason unable to perform ROS: See HPI as above.     Physical Exam Triage Vital Signs ED Triage Vitals  Enc Vitals Group     BP 02/11/20 1539 138/89     Pulse Rate 02/11/20 1539 82     Resp 02/11/20 1539 18     Temp 02/11/20 1539 98.3 F (36.8 C)     Temp Source 02/11/20 1539 Oral     SpO2 02/11/20 1539 93 %     Weight --      Height --  Head Circumference --      Peak Flow --      Pain Score 02/11/20 1544 7     Pain Loc --      Pain Edu? --      Excl. in GC? --    No data found.  Updated Vital Signs BP 138/89 (BP Location: Right Arm)   Pulse 82   Temp 98.3 F (36.8 C) (Oral)   Resp 18   SpO2 93%   Physical Exam Constitutional:      General: He is not in acute distress.    Appearance: Normal appearance. He is not ill-appearing, toxic-appearing or diaphoretic.  HENT:     Head: Normocephalic and atraumatic.     Mouth/Throat:     Mouth: Mucous membranes are moist.     Pharynx: Oropharynx is clear. Uvula midline.  Cardiovascular:     Rate and Rhythm: Normal rate and regular rhythm.     Heart sounds: Normal heart sounds. No murmur heard.  No friction rub. No gallop.   Pulmonary:     Effort: Pulmonary effort is  normal. No accessory muscle usage, prolonged expiration, respiratory distress or retractions.     Comments: Intermittent wheezing in the periphery. Good air movement Musculoskeletal:     Cervical back: Normal range of motion and neck supple.  Skin:    General: Skin is warm and dry.  Neurological:     General: No focal deficit present.     Mental Status: He is alert and oriented to person, place, and time.      UC Treatments / Results  Labs (all labs ordered are listed, but only abnormal results are displayed) Labs Reviewed  NOVEL CORONAVIRUS, NAA    EKG   Radiology No results found.  Procedures Procedures (including critical care time)  Medications Ordered in UC Medications - No data to display  Initial Impression / Assessment and Plan / UC Course  I have reviewed the triage vital signs and the nursing notes.  Pertinent labs & imaging results that were available during my care of the patient were reviewed by me and considered in my medical decision making (see chart for details).    COVID PCR test ordered. Patient to quarantine until testing results return. No alarming signs on exam.  Patient with mild wheezing in the periphery on exam.  Good air movement.  Able to speak in full sentences without difficulty.  We will continue albuterol, and do nebulizers at nighttime.  Symptomatic treatment discussed.  Push fluids.  Return precautions given.  Patient expresses understanding and agrees to plan.  Discussed to monitor wheezing/shortness of breath, may need to do prednisone if symptoms not improving.  Final Clinical Impressions(s) / UC Diagnoses   Final diagnoses:  Encounter for screening for COVID-19  Cough  Mild intermittent asthma with acute exacerbation  Nasal congestion    ED Prescriptions    Medication Sig Dispense Auth. Provider   albuterol (PROVENTIL) (2.5 MG/3ML) 0.083% nebulizer solution Take 3 mLs (2.5 mg total) by nebulization every 6 (six) hours as needed  for wheezing or shortness of breath. 75 mL Kayden Hutmacher V, PA-C   fluticasone (FLONASE) 50 MCG/ACT nasal spray Place 2 sprays into both nostrils daily. 1 g Belinda Fisher, PA-C     PDMP not reviewed this encounter.   Belinda Fisher, PA-C 02/11/20 1614

## 2020-02-11 NOTE — ED Triage Notes (Addendum)
Pt here for URI sx with cough and congestion; pt with asthma and sts used neb today with some relief

## 2020-02-11 NOTE — Discharge Instructions (Signed)
COVID PCR testing ordered. I would like you to quarantine until testing results. Albuterol inhaler/neb as needed. Flonase as directed. Tylenol/motrin for pain and fever. Keep hydrated, urine should be clear to pale yellow in color. If experiencing shortness of breath, trouble breathing, go to the emergency department for further evaluation needed.

## 2020-02-13 LAB — NOVEL CORONAVIRUS, NAA: SARS-CoV-2, NAA: NOT DETECTED

## 2020-02-13 LAB — SARS-COV-2, NAA 2 DAY TAT

## 2020-06-05 ENCOUNTER — Ambulatory Visit (INDEPENDENT_AMBULATORY_CARE_PROVIDER_SITE_OTHER): Payer: Self-pay | Admitting: Licensed Clinical Social Worker

## 2020-06-05 ENCOUNTER — Other Ambulatory Visit: Payer: Self-pay

## 2020-06-05 DIAGNOSIS — F411 Generalized anxiety disorder: Secondary | ICD-10-CM

## 2020-06-05 DIAGNOSIS — F331 Major depressive disorder, recurrent, moderate: Secondary | ICD-10-CM

## 2020-06-07 ENCOUNTER — Ambulatory Visit: Payer: Self-pay

## 2020-06-08 NOTE — BH Specialist Note (Signed)
Integrated Behavioral Health Initial In-Person Visit  MRN: [id number] Name: Jorge Williams.  Number of Integrated Behavioral Health Clinician visits:: 1/6 Session Start time: 2:40 PM  Session End time: 3:15 PM Total time: 35  minutes  Types of Service: Individual psychotherapy  Interpretor:Yes.   Interpretor Name and Language: NA   Subjective: [name]. is a 34 y.o. male accompanied by self Patient was referred by self for depression and anxiety. Patient reports the following symptoms/concerns: Pt reports difficulty managing depression and anxiety symptoms triggered by marital strain Duration of problem: Ongoing; Severity of problem: moderate  Objective: Mood: Anxious and Depressed and Affect: Appropriate Risk of harm to self or others: No plan to harm self or others Pt scored positive on phq9; however, denies current SI/HI. Protective factors identified, safety plan discussed, and crisis intervention resources provided  Life Context: Family and Social: Pt receives support from family School/Work: Pt is employed. No report of financial strain Self-Care: Pt is open to medication management and therapy Life Changes: Pt's marriage is strained resulting in complex emotions, increase in anxiety, and grief  Patient and/or Family's Strengths/Protective Factors: Social connections, Social and Emotional competence and Concrete supports in place (healthy food, safe environments, etc.)  Goals Addressed: Patient will: 1. Reduce symptoms of: anxiety and depression 2. Increase knowledge and/or ability of: coping skills  3. Demonstrate ability to: Increase adequate support systems for patient/family  Progress towards Goals: Ongoing  Interventions: Interventions utilized: Solution-Focused Strategies  Standardized Assessments completed: GAD-7 and PHQ 2&9  Patient Response: Pt was engaged during session and was successful in identifying goals  Patient Centered  Plan: Patient is on the following Treatment Plan(s):  Anxiety and Depression  Assessment: Patient currently experiencing difficulty managing depression and anxiety symptoms triggered by marital strain. Pt scored positive on phq9; however, denies current SI/HI. Protective factors identified, safety plan discussed, and crisis intervention resources provided.   Patient may benefit from therapy and medication management. Healthy coping skills identified and pt is open to behavioral health services.   Plan: 1. Follow up with behavioral health clinician on : Schedule follow up appointment 2. Behavioral recommendations: Utilize strategies discussed and resources provided 3. Referral(s): Integrated Behavioral Health Services (In Clinic) 4. "From scale of 1-10, how likely are you to follow plan?":   Bridgett Larsson, LCSW 06/08/2020 3:07 PM

## 2020-07-01 ENCOUNTER — Encounter (HOSPITAL_COMMUNITY): Payer: Self-pay

## 2020-07-01 ENCOUNTER — Emergency Department (HOSPITAL_COMMUNITY)
Admission: EM | Admit: 2020-07-01 | Discharge: 2020-07-01 | Disposition: A | Discharge status: Home / Self Care | Payer: BLUE CROSS/BLUE SHIELD | Attending: Emergency Medicine | Admitting: Emergency Medicine

## 2020-07-01 ENCOUNTER — Other Ambulatory Visit: Payer: Self-pay

## 2020-07-01 DIAGNOSIS — J45909 Unspecified asthma, uncomplicated: Secondary | ICD-10-CM | POA: Insufficient documentation

## 2020-07-01 DIAGNOSIS — F322 Major depressive disorder, single episode, severe without psychotic features: Secondary | ICD-10-CM

## 2020-07-01 DIAGNOSIS — Z7951 Long term (current) use of inhaled steroids: Secondary | ICD-10-CM | POA: Diagnosis not present

## 2020-07-01 DIAGNOSIS — R45851 Suicidal ideations: Secondary | ICD-10-CM | POA: Diagnosis not present

## 2020-07-01 DIAGNOSIS — F332 Major depressive disorder, recurrent severe without psychotic features: Secondary | ICD-10-CM | POA: Insufficient documentation

## 2020-07-01 DIAGNOSIS — Z87891 Personal history of nicotine dependence: Secondary | ICD-10-CM | POA: Insufficient documentation

## 2020-07-01 DIAGNOSIS — F32A Depression, unspecified: Secondary | ICD-10-CM

## 2020-07-01 DIAGNOSIS — Z20822 Contact with and (suspected) exposure to covid-19: Secondary | ICD-10-CM | POA: Insufficient documentation

## 2020-07-01 DIAGNOSIS — F329 Major depressive disorder, single episode, unspecified: Secondary | ICD-10-CM | POA: Diagnosis present

## 2020-07-01 LAB — COMPREHENSIVE METABOLIC PANEL
ALT: 15 U/L (ref 0–44)
AST: 19 U/L (ref 15–41)
Albumin: 3.9 g/dL (ref 3.5–5.0)
Alkaline Phosphatase: 48 U/L (ref 38–126)
Anion gap: 9 (ref 5–15)
BUN: 14 mg/dL (ref 6–20)
CO2: 24 mmol/L (ref 22–32)
Calcium: 9.5 mg/dL (ref 8.9–10.3)
Chloride: 103 mmol/L (ref 98–111)
Creatinine, Ser: 0.85 mg/dL (ref 0.61–1.24)
GFR, Estimated: >60 mL/min (ref >60)
Glucose, Bld: 85 mg/dL (ref 70–99)
Potassium: 4.2 mmol/L (ref 3.5–5.1)
Sodium: 136 mmol/L (ref 135–145)
Total Bilirubin: 0.5 mg/dL (ref 0.3–1.2)
Total Protein: 6.5 g/dL (ref 6.5–8.1)

## 2020-07-01 LAB — ETHANOL: Alcohol, Ethyl (B): <10 mg/dL (ref <10)

## 2020-07-01 LAB — CBC
HCT: 36.9 % — ABNORMAL LOW (ref 39.0–52.0)
Hemoglobin: 12.5 g/dL — ABNORMAL LOW (ref 13.0–17.0)
MCH: 30.5 pg (ref 26.0–34.0)
MCHC: 33.9 g/dL (ref 30.0–36.0)
MCV: 90 fL (ref 80.0–100.0)
Platelets: 301 10*3/uL (ref 150–400)
RBC: 4.1 MIL/uL — ABNORMAL LOW (ref 4.22–5.81)
RDW: 13.1 % (ref 11.5–15.5)
WBC: 4.8 10*3/uL (ref 4.0–10.5)
nRBC: 0 % (ref 0.0–0.2)

## 2020-07-01 LAB — RESP PANEL BY RT-PCR (FLU A&B, COVID) ARPGX2
Influenza A by PCR: NEGATIVE
Influenza B by PCR: NEGATIVE
SARS Coronavirus 2 by RT PCR: NEGATIVE

## 2020-07-01 LAB — SALICYLATE LEVEL: Salicylate Lvl: <7 mg/dL — ABNORMAL LOW (ref 7.0–30.0)

## 2020-07-01 LAB — ACETAMINOPHEN LEVEL: Acetaminophen (Tylenol), Serum: <10 ug/mL — ABNORMAL LOW (ref 10–30)

## 2020-07-01 NOTE — Discharge Instructions (Signed)
Stay with your mom and dad tonight so that you are not alone. Look for mental health services tomorrow so that you can get some help. If you start feeling worse you can always return to the emergency room.

## 2020-07-01 NOTE — ED Provider Notes (Signed)
[facility] EMERGENCY DEPARTMENT Provider Note   CSN: [id number] Arrival date & time: 07/01/20  0830     History Chief Complaint  Patient presents with  . Suicidal  . Depression    Jorge Williams. is a 34 y.o. male.  The history is provided by the patient.   Jorge Williams. is a 34 y.o. male who presents to the Emergency Department complaining of SI and depression.  He presents to the ED complaining of one year of depressive symptoms and SI.  He presents voluntarily.  He reports ongoing thoughts of not wanting to be alive.  He does not have a current plan but has googles suicide and at times calls the suicide hotline and will feel better temporarily.  He lives at home alone with his dog.  He separated from his wife one year ago and that made sxs worse.  He smokes tobacco, occasional alcohol - none today, smokes daily marijuanna.  Sxs are severe and constant in nature.  He sees a Veterinary surgeon on [address].    About one year ago he did attempt to kill himself by putting a belt around his neck.    Past Medical History:  Diagnosis Date  . Asthma 1994  . Bronchitis 1997  . Pneumonia 2013    Patient Active Problem List   Diagnosis Date Noted  . Current severe episode of major depressive disorder without psychotic features without prior episode (HCC)   . Anxiety and depression 03/24/2019  . Family history of systemic lupus erythematosus 03/24/2019  . Family history of multiple sclerosis 03/24/2019  . Tremor 01/17/2019  . Paresthesia 01/17/2019  . SOB (shortness of breath) 07/24/2017    History reviewed. No pertinent surgical history.     Family History  Problem Relation Age of Onset  . Hypertension Mother   . Healthy Father   . Hypertension Father   . Healthy Sister   . Multiple sclerosis Brother   . Stroke Maternal Grandmother   . Hypertension Maternal Grandmother   . Hypertension Maternal Grandfather   . COPD Paternal Grandmother   .  Asthma Paternal Grandmother     Social History   Tobacco Use  . Smoking status: Former Smoker    Types: Cigars  . Smokeless tobacco: Never Used  Vaping Use  . Vaping Use: Never used  Substance Use Topics  . Alcohol use: Yes    Comment: occ  . Drug use: Yes    Frequency: 7.0 times per week    Types: Marijuana    Comment: daily    Home Medications Prior to Admission medications   Medication Sig Start Date End Date Taking? Authorizing Provider  albuterol (PROVENTIL) (2.5 MG/3ML) 0.083% nebulizer solution Take 3 mLs (2.5 mg total) by nebulization every 6 (six) hours as needed for wheezing or shortness of breath. 02/11/20   Cathie Hoops, Amy V, PA-C  albuterol (VENTOLIN HFA) 108 (90 Base) MCG/ACT inhaler Inhale 2 puffs into the lungs every 4 (four) hours as needed for wheezing or shortness of breath (cough, shortness of breath or wheezing.). 11/09/19   Arvilla Market, DO  fluticasone (FLONASE) 50 MCG/ACT nasal spray Place 2 sprays into both nostrils daily. 02/11/20   Belinda Fisher, PA-C  fluticasone (FLOVENT HFA) 44 MCG/ACT inhaler Inhale 2 puffs twice daily 11/09/19   Arvilla Market, DO  hydroxychloroquine (PLAQUENIL) 200 MG tablet Take 1 tablet 200 mg BID Monday-Friday Patient not taking: Reported on 02/11/2020 04/14/19   Pollyann Savoy,  MD    Allergies    Apple  Review of Systems   Review of Systems  All other systems reviewed and are negative.   Physical Exam Updated Vital Signs BP 115/68 (BP Location: Left Arm)   Pulse 96   Temp 98.7 F (37.1 C) (Oral)   Resp 14   SpO2 96%   Physical Exam Vitals and nursing note reviewed.  Constitutional:      Appearance: He is well-developed and well-nourished.  HENT:     Head: Normocephalic and atraumatic.  Cardiovascular:     Rate and Rhythm: Normal rate and regular rhythm.     Heart sounds: No murmur heard.   Pulmonary:     Effort: Pulmonary effort is normal. No respiratory distress.     Breath sounds: Normal breath  sounds.  Abdominal:     Palpations: Abdomen is soft.     Tenderness: There is no abdominal tenderness. There is no guarding or rebound.  Musculoskeletal:        General: No tenderness or edema.  Skin:    General: Skin is warm and dry.  Neurological:     Mental Status: He is alert and oriented to person, place, and time.  Psychiatric:        Mood and Affect: Mood and affect normal.     Comments: Depressed mood, tearful     ED Results / Procedures / Treatments   Labs (all labs ordered are listed, but only abnormal results are displayed) Labs Reviewed  SALICYLATE LEVEL - Abnormal; Notable for the following components:      Result Value   Salicylate Lvl <7.0 (*)    All other components within normal limits  ACETAMINOPHEN LEVEL - Abnormal; Notable for the following components:   Acetaminophen (Tylenol), Serum <10 (*)    All other components within normal limits  CBC - Abnormal; Notable for the following components:   RBC 4.10 (*)    Hemoglobin 12.5 (*)    HCT 36.9 (*)    All other components within normal limits  RESP PANEL BY RT-PCR (FLU A&B, COVID) ARPGX2  COMPREHENSIVE METABOLIC PANEL  ETHANOL  RAPID URINE DRUG SCREEN, HOSP PERFORMED    EKG None  Radiology No results found.  Procedures Procedures (including critical care time)  Medications Ordered in ED Medications - No data to display  ED Course  I have reviewed the triage vital signs and the nursing notes.  Pertinent labs & imaging results that were available during my care of the patient were reviewed by me and considered in my medical decision making (see chart for details).    MDM Rules/Calculators/A&P                         patient presents to the emergency department voluntarily for suicidal thoughts and depressive symptoms. He has been medically cleared for psychiatric evaluation and treatment.  Patient has been seen by TTS with recommendation for transfer to [facility]. Discussed with patient plan and  he is in agreement. Discussed with Shuvon Rankin at behavioral health, will transfer for further treatment. Final Clinical Impression(s) / ED Diagnoses Final diagnoses:  Suicidal ideation    Rx / DC Orders ED Discharge Orders    None       Tilden Fossa, MD 07/01/20 1536

## 2020-07-01 NOTE — ED Triage Notes (Signed)
Patient complains of increased depression and states that he has had intermittent thoughts of harming self. Patient alert and oriented, not taking meds as prescribed. Reports some increased use of ETOH. Denies drug use.

## 2020-07-01 NOTE — BH Assessment (Signed)
Tele Assessment Note   Patient Name: Jorge Williams. MRN: [id number] Referring Physician: EDP Location of Patient: [facility] Location of Provider: Behavioral Health TTS Department  Jorge Williams. is a 34 y.o. male who presented to [facility] on a voluntary basis with complaint of suicidal ideation, despondency, and other symptoms.  Pt lives in alone in [place], and he is employed with [employer].  Pt works as a Hospital doctor.  He does not receive outpatient psychiatric treatment.  He indicated that he sometimes meets with Jenel Lucks, a therapist with [facility].    Pt reported that he has felt depressed for a year due to separation from wife.  He stated that he decided to come to the hospital today because ''if I didn't, I'm afraid something bad would happen to me.''  Pt explained that he meant a suicide attempt.  Pt endorsed several suicide attempts, including an attempt to hang himself about a year ago.  He also stated that six months ago, he considered jumping off of a bridge.  In addition to suicidal ideation, Pt endorsed persistent despondency, body tension associated with worry, hopelessness and worthlessness, isolation, poor sleep and poor appetite, and tearfulness.  Pt also endorsed an increase in alcohol use to help him sleep.  He stated that he drinks episodically, but when he does, it can be to intoxication.  Pt denied homicidal ideation, hallucination, and self-injurious behavior.  Pt stated that he has been prescribed psychotropic medication in the past, but he stopped taking it because he could not tolerate the side effects.  During assessment, Pt presented as alert and oriented.  He had good eye contact and was cooperative.  Pt was appropriately groomed.  Pt's mood was depressed, and affect was blunted.  Pt's speech was normal in rate, rhythm, and volume.  Thought processes were within normal range, and thought content was logical and goal-oriented.  There was no evidence of  delusion.  Pt's memory and concentration were intact.  Insight, judgment, and impulse control were fair.  Consulted with Molli Knock, NP, who determined that Pt is appropriate for [facility].  He is to be transferred to [facility] for observation and medication.  Diagnosis: Major Depressive Disorder, Recurrent, Severe  Past Medical History:  Past Medical History:  Diagnosis Date  . Asthma 1994  . Bronchitis 1997  . Pneumonia 2013    History reviewed. No pertinent surgical history.  Family History:  Family History  Problem Relation Age of Onset  . Hypertension Mother   . Healthy Father   . Hypertension Father   . Healthy Sister   . Multiple sclerosis Brother   . Stroke Maternal Grandmother   . Hypertension Maternal Grandmother   . Hypertension Maternal Grandfather   . COPD Paternal Grandmother   . Asthma Paternal Grandmother     Social History:  reports that he has quit smoking. His smoking use included cigars. He has never used smokeless tobacco. He reports current alcohol use. He reports current drug use. Frequency: 7.00 times per week. Drug: Marijuana.  Additional Social History:  Alcohol / Drug Use Pain Medications: Please see MAR Prescriptions: Please see MAR Over the Counter: Please see MAR History of alcohol / drug use?: Yes Substance #1 Name of Substance 1: Alcohol 1 - Amount (size/oz): Varied 1 - Frequency: Episodic 1 - Duration: Ongoing 1 - Last Use / Amount: 06/30/20 -- ''1/2 bottle of liquor''  CIWA: CIWA-Ar BP: 115/68 Pulse Rate: 96 COWS:    Allergies:  Allergies  Allergen Reactions  . Apple Itching    Apples make the gums itch    Home Medications: (Not in a hospital admission)   OB/GYN Status:  No LMP for male patient.  General Assessment Data Date Telepsych consult ordered in [facility]: 07/01/20                                                 Advance Directives (For Healthcare) Does Patient Have a Medical Advance Directive?:  No Would patient like information on creating a medical advance directive?: No - Patient declined          Disposition:     This service was provided via telemedicine using a 2-way, interactive audio and video technology.  Names of all persons participating in this telemedicine service and their role in this encounter. Name: Valli Glance Role: Patient             Earline Mayotte 07/01/2020 1:36 PM

## 2020-07-01 NOTE — ED Notes (Signed)
Pt is still dressed he is in a hallway bed  He has been on the phone with his parfents al,ost the entire time he is ba ck here he

## 2020-07-01 NOTE — ED Provider Notes (Signed)
Patient reports that he is desiring to leave. Had a long discussion with the patient and he reports he does want help but he does not want to wait any longer in the emergency room. He reports his symptoms have been going on for approximately a year and he has had suicidal thoughts intermittently over that 1 year. It has been worse over the holidays and especially when he is at home alone. Patient's father and mother are present at the hospital. Father reports that he will stay with them at their home and he will watch him and ensure his safety. Speaking with the patient one-on-one without parents present he reports that he can contract for safety because he is going to go stay with his parents and he will not hurt himself when he goes home. Father reports that they will seek mental health services tomorrow based on the patient's insurance. He was also given resources from the emergency room as well. There are no weapons present in the home and patient will be monitored by his parents.   Gwyneth Sprout, MD 07/01/20 1743

## 2020-07-04 ENCOUNTER — Telehealth: Payer: Self-pay | Admitting: Family Medicine

## 2020-07-04 NOTE — Telephone Encounter (Signed)
Copied from CRM [id number]. Topic: General - Other >> Jul 04, 2020 10:09 AM Gwenlyn Fudge wrote: Reason for CRM: Pt called and is requesting to speak with [facility]. He states that he would like to speak with her regarding getting into a behavioral health program. He states that he went to ED on 07/02/20. He states that they tried to admit him, but he declined due to not wanting to be inpatient. Please advise.

## 2020-07-04 NOTE — Telephone Encounter (Signed)
Return call placed to patient. Patient reported an increase in symptoms resulting in a visit to the ED. Per pt, he did not feel comfortable receiving inpatient treatment and left. Currently, pt denies suicidal ideations. Pt is interested in outpatient services (medication management and therapy)   LCSW provided encouragement and discussed benefits to participating in treatment. Pt agreed to visit the [facility] on 07/05/20 during walk-in hours to initiate services. He agreed to follow up with LCSW by Friday, December 31, 21 with treatment plan. Pt is aware that the facility also offers urgent care for behavioral health noting that he receives strong support from family. No additional concerns noted.

## 2020-08-01 ENCOUNTER — Other Ambulatory Visit: Payer: Self-pay

## 2020-08-02 ENCOUNTER — Encounter: Payer: Self-pay | Admitting: Internal Medicine

## 2020-08-02 ENCOUNTER — Ambulatory Visit (INDEPENDENT_AMBULATORY_CARE_PROVIDER_SITE_OTHER): Payer: BLUE CROSS/BLUE SHIELD | Admitting: Licensed Clinical Social Worker

## 2020-08-02 ENCOUNTER — Ambulatory Visit (INDEPENDENT_AMBULATORY_CARE_PROVIDER_SITE_OTHER): Payer: BLUE CROSS/BLUE SHIELD | Admitting: Internal Medicine

## 2020-08-02 VITALS — BP 118/90 | HR 54 | Temp 98.2°F | Resp 18 | Ht 73.0 in | Wt 136.2 lb

## 2020-08-02 DIAGNOSIS — Z1159 Encounter for screening for other viral diseases: Secondary | ICD-10-CM | POA: Diagnosis not present

## 2020-08-02 DIAGNOSIS — Z13228 Encounter for screening for other metabolic disorders: Secondary | ICD-10-CM | POA: Diagnosis not present

## 2020-08-02 DIAGNOSIS — F331 Major depressive disorder, recurrent, moderate: Secondary | ICD-10-CM

## 2020-08-02 DIAGNOSIS — Z113 Encounter for screening for infections with a predominantly sexual mode of transmission: Secondary | ICD-10-CM

## 2020-08-02 DIAGNOSIS — Z0000 Encounter for general adult medical examination without abnormal findings: Secondary | ICD-10-CM

## 2020-08-02 DIAGNOSIS — J4530 Mild persistent asthma, uncomplicated: Secondary | ICD-10-CM

## 2020-08-02 DIAGNOSIS — Z130 Encounter for screening for diseases of the blood and blood-forming organs and certain disorders involving the immune mechanism: Secondary | ICD-10-CM

## 2020-08-02 DIAGNOSIS — F32A Depression, unspecified: Secondary | ICD-10-CM

## 2020-08-02 DIAGNOSIS — F419 Anxiety disorder, unspecified: Secondary | ICD-10-CM

## 2020-08-02 MED ORDER — ALBUTEROL SULFATE HFA 108 (90 BASE) MCG/ACT IN AERS
2.0000 | INHALATION_SPRAY | RESPIRATORY_TRACT | 5 refills | Status: DC | PRN
Start: 2020-08-02 — End: 2022-03-24

## 2020-08-02 MED ORDER — FLUOXETINE HCL 20 MG PO TABS
20.0000 mg | ORAL_TABLET | Freq: Every day | ORAL | 3 refills | Status: DC
Start: 2020-08-02 — End: 2022-04-08

## 2020-08-02 MED ORDER — FLOVENT HFA 44 MCG/ACT IN AERO: INHALATION_SPRAY | RESPIRATORY_TRACT | 12 refills | Status: DC

## 2020-08-02 NOTE — Patient Instructions (Addendum)
24/7 Behavioral Health Crisis Centers by [place]: [place]: - [facility] (24/7): [phone]   [address] [place], [place] [address] - [place] 24 Hour Crisis Line: [phone]     Behavioral Health Resources:   What if I or someone I know is in crisis?  . If you are thinking about harming yourself or having thoughts of suicide, or if you know someone who is, seek help right away.  . Call your doctor or mental health care provider.  . Call 911 or go to a hospital emergency room to get immediate help, or ask a friend or family member to help you do these things; IF YOU ARE IN [place], YOU MAY GO TO WALK-IN URGENT CARE 24/7 at [facility] (see below)  . Call the [place] National Suicide Prevention Lifeline's toll-free, 24-hour hotline at 1-800-273-TALK 774-794-1442) or TTY: 1-800-799-4 TTY 5623618548) to talk to a trained counselor.  . If you are in crisis, make sure you are not left alone.   . If someone else is in crisis, make sure he or she is not left alone   24 Hour :   [facility]  [address], [place], [place] [address] [phone] or [phone] WALK-IN URGENT CARE 24/7  [facility]: [phone]  [place] National Suicide Hotline: (706)790-6169  [facility] [employer] (Domestic Violence, Rape & Victim Assistance)  [phone]  [facility] - [facility]  [address], [place]  [address]   [phone] or [phone]   [facility]: [phone] (8am-4pm) or ([phone] (after hours)

## 2020-08-02 NOTE — Progress Notes (Signed)
RF on albuterol inhaler  Discuss emotional support animal

## 2020-08-02 NOTE — Progress Notes (Signed)
Subjective:    Jorge Williams. - 35 y.o. male MRN [id number]  Date of birth: [birth year]  HPI  Jorge Williams. is here for annual exam.  Patient reports in terms of his mood, he feels good today. He is interested in getting documentation for an emotional support dog. Says he has been on medications in the past. One was Prozac and one was for anxiety. 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 Interested in starting another medication today. Says in terms of harming himself, he hasn't felt that in several weeks. After Christmas, he checked himself into the ER to be assessed for concerns about his mood and suicidal thoughts. Says that he has felt really down since his wife left him last year. They were raising a daughter together. Feels like he has to be strong for his daughter and that does motivate him to keep going. He is aware of suicide hotline and has spoken to them several times. Says he does not want to die.    Depression screen [facility] 2/9 08/02/2020 07/01/2020 06/05/2020  Decreased Interest _0 Down, Depressed, Hopeless _1 PHQ - 2 Score _2 Altered sleeping _3 Tired, decreased energy 1 1 0  Change in appetite _4 Feeling bad or failure about yourself  _5 Trouble concentrating 1 0 1  Moving slowly or fidgety/restless 0 0 0  Suicidal thoughts _6 PHQ-9 Score _7 Difficult doing work/chores Very difficult Very difficult -    Health Maintenance:  Health Maintenance Due  Topic Date Due  . Hepatitis C Screening  Never done  . HIV Screening  Never done    -  reports that he has quit smoking. His smoking use included cigars. He has never used smokeless tobacco. - Review of Systems: Per HPI. - Past Medical History: Patient Active Problem List   Diagnosis Date Noted  . Current severe episode of major depressive disorder without psychotic features without prior episode ([place])    . Anxiety and depression 03/24/2019  . Family history of systemic lupus erythematosus 03/24/2019  . Family history of multiple sclerosis 03/24/2019  . Tremor 01/17/2019  . Paresthesia 01/17/2019  . SOB (shortness of breath) 07/24/2017   - Medications: reviewed and updated   Objective:   Physical Exam BP 118/90 (BP Location: Right Arm, Patient Position: Sitting, Cuff Size: Normal)   Pulse (!) 54   Temp 98.2 F (36.8 C) (Oral)   Resp 18   Ht _8  (1.854 m)   Wt 136 lb 3.2 oz (61.8 kg)   SpO2 98%   BMI 17.97 kg/m  Physical Exam Constitutional:      Appearance: He is not diaphoretic.  HENT:     Head: Normocephalic and atraumatic.     Mouth/Throat:     Mouth: Oropharynx is clear and moist.      Comments: TMs normal bilaterally Eyes:     Extraocular Movements: EOM normal.     Conjunctiva/sclera: Conjunctivae normal.     Pupils: Pupils are equal, round, and reactive to light.  Neck:     Thyroid: No thyromegaly.  Cardiovascular:     Rate and Rhythm: Normal rate and regular rhythm.     Pulses: Intact distal pulses.     Heart sounds: Normal heart sounds. No murmur heard.   Pulmonary:  Effort: Pulmonary effort is normal. No respiratory distress.     Breath sounds: Normal breath sounds. No wheezing.  Abdominal:     General: Bowel sounds are normal. There is no distension.     Palpations: Abdomen is soft.     Tenderness: There is no abdominal tenderness. There is no guarding or rebound.  Musculoskeletal:        General: No deformity or edema. Normal range of motion.     Cervical back: Normal range of motion and neck supple.  Lymphadenopathy:     Cervical: No cervical adenopathy.  Skin:    General: Skin is warm and dry.     Findings: No rash.  Neurological:     Mental Status: He is alert and oriented to person, place, and time.     Gait: Gait is intact.  Psychiatric:        Mood and Affect: Mood and affect normal.        Judgment: Judgment normal.             Assessment & Plan:   1. Encounter for annual physical exam Counseled on 150 minutes of exercise per week, healthy eating (including decreased daily intake of saturated fats, cholesterol, added sugars, sodium), STI prevention, routine healthcare maintenance.  2. Need for hepatitis C screening test - Hepatitis C antibody  3. Screening for metabolic disorder - Comprehensive metabolic panel - Lipid panel  4. Screening for deficiency anemia - CBC  5. Screening for STD (sexually transmitted disease) - HIV Antibody (routine testing w rflx) - RPR - GC/Chlamydia Probe Amp  6. Mild persistent asthma without complication Discussed appropriate ways to use controller inhaler versus rescue inhaler.  - albuterol (VENTOLIN HFA) 108 (90 Base) MCG/ACT inhaler; Inhale 2 puffs into the lungs every 4 (four) hours as needed for wheezing or shortness of breath (cough, shortness of breath or wheezing.).  Dispense: 18 g; Refill: 5 - fluticasone (FLOVENT HFA) 44 MCG/ACT inhaler; Inhale 2 puffs twice daily  Dispense: 1 each; Refill: 12  7. Anxiety and depression Discussed depression and anxiety with patient. Suspect the medication that made him sleepy was Hydroxyzine as this was prescribed at same time as Prozac. Patient agreeable to trial of Prozac again. Discussed possible side effects, how to take medication, and time needed until achieving efficacy. Has passive SI but contracted for safety. He was provided 24/7 behavioral health crisis resources. He met with Christa See, LCSW today and will have 2 week f/u to ensure medication adherence and to follow up on mood. Will send emotional support pet documentation through [place] as suspect patient could medically benefit from this.  - FLUoxetine (PROZAC) 20 MG tablet; Take 1 tablet (20 mg total) by mouth daily.  Dispense: 30 tablet; Refill: [place], D.O. 08/02/2020, 9:09 AM Primary Care at [facility]

## 2020-08-03 LAB — COMPREHENSIVE METABOLIC PANEL
ALT: 9 IU/L (ref 0–44)
AST: 13 IU/L (ref 0–40)
Albumin/Globulin Ratio: 2 (ref 1.2–2.2)
Albumin: 4.6 g/dL (ref 4.0–5.0)
Alkaline Phosphatase: 62 IU/L (ref 44–121)
BUN/Creatinine Ratio: 24 — ABNORMAL HIGH (ref 9–20)
BUN: 16 mg/dL (ref 6–20)
Bilirubin Total: 0.3 mg/dL (ref 0.0–1.2)
CO2: 22 mmol/L (ref 20–29)
Calcium: 9.8 mg/dL (ref 8.7–10.2)
Chloride: 106 mmol/L (ref 96–106)
Creatinine, Ser: 0.67 mg/dL — ABNORMAL LOW (ref 0.76–1.27)
GFR calc Af Amer: 145 mL/min/{1.73_m2} (ref >59)
GFR calc non Af Amer: 125 mL/min/{1.73_m2} (ref >59)
Globulin, Total: 2.3 g/dL (ref 1.5–4.5)
Glucose: 90 mg/dL (ref 65–99)
Potassium: 4.6 mmol/L (ref 3.5–5.2)
Sodium: 140 mmol/L (ref 134–144)
Total Protein: 6.9 g/dL (ref 6.0–8.5)

## 2020-08-03 LAB — LIPID PANEL
Chol/HDL Ratio: 2.8 ratio (ref 0.0–5.0)
Cholesterol, Total: 203 mg/dL — ABNORMAL HIGH (ref 100–199)
HDL: 72 mg/dL (ref >39)
LDL Chol Calc (NIH): 121 mg/dL — ABNORMAL HIGH (ref 0–99)
Triglycerides: 53 mg/dL (ref 0–149)
VLDL Cholesterol Cal: 10 mg/dL (ref 5–40)

## 2020-08-03 LAB — GC/CHLAMYDIA PROBE AMP
Chlamydia trachomatis, NAA: NEGATIVE
Neisseria Gonorrhoeae by PCR: NEGATIVE

## 2020-08-03 LAB — CBC
Hematocrit: 35.6 % — ABNORMAL LOW (ref 37.5–51.0)
Hemoglobin: 12.3 g/dL — ABNORMAL LOW (ref 13.0–17.7)
MCH: 31.5 pg (ref 26.6–33.0)
MCHC: 34.6 g/dL (ref 31.5–35.7)
MCV: 91 fL (ref 79–97)
Platelets: 316 10*3/uL (ref 150–450)
RBC: 3.91 x10E6/uL — ABNORMAL LOW (ref 4.14–5.80)
RDW: 13.2 % (ref 11.6–15.4)
WBC: 5.7 10*3/uL (ref 3.4–10.8)

## 2020-08-03 LAB — HEPATITIS C ANTIBODY: Hep C Virus Ab: <0.1 s/co ratio (ref 0.0–0.9)

## 2020-08-03 LAB — HIV ANTIBODY (ROUTINE TESTING W REFLEX): HIV Screen 4th Generation wRfx: NONREACTIVE

## 2020-08-03 LAB — RPR: RPR Ser Ql: NONREACTIVE

## 2020-08-13 NOTE — BH Specialist Note (Signed)
LCSW met with pt briefly after his appointment with PCP. Pt shared that he has not established behavioral health services through [facility]; however, is open to re-initiating medications through PCP to assist with management of depression and anxiety symptoms. He denies SI/HI and identified best friend and family as a strong support system. Follow up appt scheduled for 08/16/20

## 2020-08-16 ENCOUNTER — Other Ambulatory Visit: Payer: Self-pay

## 2020-08-16 ENCOUNTER — Ambulatory Visit (INDEPENDENT_AMBULATORY_CARE_PROVIDER_SITE_OTHER): Payer: BLUE CROSS/BLUE SHIELD | Admitting: Licensed Clinical Social Worker

## 2020-08-16 DIAGNOSIS — F331 Major depressive disorder, recurrent, moderate: Secondary | ICD-10-CM

## 2020-08-20 NOTE — BH Specialist Note (Signed)
Follow up call placed to patient. Pt reported that he is doing well, states he had one event where he was triggered and experienced overwhelming feelings. Denies any SI/HI.   Pt has not had the opportunity to pick up prescribed medication to assist with behavioral health symptoms. States plan is to pick it up this week. Follow up appt scheduled for 09/06/20. No additional concerns noted.

## 2020-09-06 ENCOUNTER — Ambulatory Visit: Payer: BLUE CROSS/BLUE SHIELD | Admitting: Licensed Clinical Social Worker

## 2021-07-02 ENCOUNTER — Emergency Department (HOSPITAL_COMMUNITY)
Admission: EM | Admit: 2021-07-02 | Discharge: 2021-07-02 | Disposition: A | Discharge status: Home / Self Care | Payer: BLUE CROSS/BLUE SHIELD | Attending: Emergency Medicine | Admitting: Emergency Medicine

## 2021-07-02 ENCOUNTER — Encounter (HOSPITAL_COMMUNITY): Payer: Self-pay

## 2021-07-02 DIAGNOSIS — Z7951 Long term (current) use of inhaled steroids: Secondary | ICD-10-CM | POA: Insufficient documentation

## 2021-07-02 DIAGNOSIS — Z87891 Personal history of nicotine dependence: Secondary | ICD-10-CM | POA: Insufficient documentation

## 2021-07-02 DIAGNOSIS — R21 Rash and other nonspecific skin eruption: Secondary | ICD-10-CM | POA: Insufficient documentation

## 2021-07-02 DIAGNOSIS — J45909 Unspecified asthma, uncomplicated: Secondary | ICD-10-CM | POA: Insufficient documentation

## 2021-07-02 MED ORDER — HYDROXYZINE HCL 25 MG PO TABS: 25.0000 mg | ORAL_TABLET | Freq: Four times a day (QID) | ORAL | 0 refills | Status: DC

## 2021-07-02 NOTE — ED Triage Notes (Signed)
Pt presents with c/o rash on his legs, arms, and buttocks. Pt reports that he is afraid he either took too much or misused the dayquil that he was taking. Pt is alert and oriented x 4.

## 2021-07-02 NOTE — Discharge Instructions (Addendum)
Please take hydroxyzine which I prescribed you for itching.  Plenty of water.  Follow-up with your primary care provider.  You may use Benadryl 50 mg at bedtime for itching and will also help you sleep.  Please use Eucerin ointment on the areas of your arms that have a rash twice daily

## 2021-07-02 NOTE — ED Provider Notes (Signed)
[facility]-EMERGENCY DEPT Provider Note   CSN: [id number] Arrival date & time: 07/02/21  1036     History Chief Complaint  Patient presents with   Rash    Jorge E Dezmen Alcock. is a 35 y.o. male.   Rash Associated symptoms: no fever, no headaches and no shortness of breath     Patient is a 35 year old male with past medical history significant for asthma he is presented to the emergency room today with complaints of bilateral rashes on his arms in the antecubital fossa also seems to be having some mild rash in his legs.  He states that symptoms been ongoing for 2 to 3 days.  He states that it is itchy and uncomfortable at night.  No known exposure to new soaps detergents or new medications.  No new animals in the house.  Denies any fevers or chills difficulty breathing nausea vomiting diarrhea.  No other associate symptoms.  No aggravating mitigating factors has not tried any medications prior to arrival other than some DayQuil which was ineffective.     Past Medical History:  Diagnosis Date   Asthma 1994   Bronchitis 1997   Pneumonia 2013    Patient Active Problem List   Diagnosis Date Noted   Current severe episode of major depressive disorder without psychotic features without prior episode ([facility])    Anxiety and depression 03/24/2019   Family history of systemic lupus erythematosus 03/24/2019   Family history of multiple sclerosis 03/24/2019   Tremor 01/17/2019   Paresthesia 01/17/2019   SOB (shortness of breath) 07/24/2017    History reviewed. No pertinent surgical history.     Family History  Problem Relation Age of Onset   Hypertension Mother    Healthy Father    Hypertension Father    Healthy Sister    Multiple sclerosis Brother    Stroke Maternal Grandmother    Hypertension Maternal Grandmother    Hypertension Maternal Grandfather    COPD Paternal Grandmother    Asthma Paternal Grandmother     Social History   Tobacco Use    Smoking status: Former    Types: Cigars   Smokeless tobacco: Never  Building services engineer Use: Never used  Substance Use Topics   Alcohol use: Yes    Comment: occ   Drug use: Yes    Frequency: 7.0 times per week    Types: Marijuana    Comment: daily    Home Medications Prior to Admission medications   Medication Sig Start Date End Date Taking? Authorizing Provider  hydrOXYzine (ATARAX) 25 MG tablet Take 1 tablet (25 mg total) by mouth every 6 (six) hours. 07/02/21  Yes Arwa Yero, Stevphen Meuse S, PA  acetaminophen (TYLENOL) 500 MG tablet Take 1,000 mg by mouth every 6 (six) hours as needed for headache (pain).    [provider]  albuterol (PROVENTIL) (2.5 MG/3ML) 0.083% nebulizer solution Take 3 mLs (2.5 mg total) by nebulization every 6 (six) hours as needed for wheezing or shortness of breath. 02/11/20   Cathie Hoops, Amy V, PA-C  albuterol (VENTOLIN HFA) 108 (90 Base) MCG/ACT inhaler Inhale 2 puffs into the lungs every 4 (four) hours as needed for wheezing or shortness of breath (cough, shortness of breath or wheezing.). 08/02/20   Arvilla Market, MD  FLUoxetine (PROZAC) 20 MG tablet Take 1 tablet (20 mg total) by mouth daily. 08/02/20   Arvilla Market, MD  fluticasone (FLONASE) 50 MCG/ACT nasal spray Place 2 sprays into both nostrils  daily. 02/11/20   Belinda Fisher, PA-C  fluticasone (FLOVENT HFA) 44 MCG/ACT inhaler Inhale 2 puffs twice daily 08/02/20   Arvilla Market, MD    Allergies    Apple and Other  Review of Systems   Review of Systems  Constitutional:  Negative for fever.  HENT:  Negative for congestion.   Respiratory:  Negative for shortness of breath.   Cardiovascular:  Negative for chest pain.  Gastrointestinal:  Negative for abdominal distention.  Skin:  Positive for rash.  Neurological:  Negative for dizziness and headaches.   Physical Exam Updated Vital Signs BP 121/74 (BP Location: Left Wrist)    Pulse 61    Temp 98.5 F (36.9 C) (Oral)    Resp  18    SpO2 99%   Physical Exam Vitals and nursing note reviewed.  Constitutional:      General: He is not in acute distress.    Appearance: Normal appearance. He is not ill-appearing.  HENT:     Head: Normocephalic and atraumatic.  Eyes:     General: No scleral icterus.       Right eye: No discharge.        Left eye: No discharge.     Conjunctiva/sclera: Conjunctivae normal.  Pulmonary:     Effort: Pulmonary effort is normal.     Breath sounds: No stridor.  Skin:    General: Skin is warm and dry.     Comments: Somewhat dry appearing skin with faint excoriations in bilateral antecubital fossa no erythema or tenderness to palpation.  No desquamation of skin.  Sensation intact.  Slightly bumpy appearance here but no papules or significant skin lesions.  Neurological:     Mental Status: He is alert and oriented to person, place, and time. Mental status is at baseline.    ED Results / Procedures / Treatments   Labs (all labs ordered are listed, but only abnormal results are displayed) Labs Reviewed - No data to display  EKG None  Radiology No results found.  Procedures Procedures   Medications Ordered in ED Medications - No data to display  ED Course  I have reviewed the triage vital signs and the nursing notes.  Pertinent labs & imaging results that were available during my care of the patient were reviewed by me and considered in my medical decision making (see chart for details).    MDM Rules/Calculators/A&P                          Physical exam and history most consistent with some sort of contact dermatitis.  Patient is young well-appearing has no history of renal disease he has rash that is only in his antecubital fossa bilateral legs are clear with no rashes although he states he does have some itchiness at this time as it seems to have resolved at this time and he does not see any rash either.  No systemic symptoms.  Will recommend hydralazine at home and  topical Eucerin.  We will follow-up with PCP/dermatologist.  Return precautions given   Final Clinical Impression(s) / ED Diagnoses Final diagnoses:  Rash and nonspecific skin eruption    Rx / DC Orders ED Discharge Orders          Ordered    hydrOXYzine (ATARAX) 25 MG tablet  Every 6 hours        07/02/21 1609             Jacky Dross,  Rodrigo Ran, PA 07/02/21 1827    Pricilla Loveless, MD 07/04/21 [phone]

## 2021-07-05 ENCOUNTER — Other Ambulatory Visit: Payer: Self-pay

## 2021-07-05 ENCOUNTER — Ambulatory Visit (INDEPENDENT_AMBULATORY_CARE_PROVIDER_SITE_OTHER): Payer: Self-pay | Admitting: Family

## 2021-07-05 ENCOUNTER — Encounter: Payer: Self-pay | Admitting: Family

## 2021-07-05 VITALS — BP 108/61 | HR 63 | Temp 98.2°F | Resp 16 | Wt 131.8 lb

## 2021-07-05 DIAGNOSIS — R21 Rash and other nonspecific skin eruption: Secondary | ICD-10-CM

## 2021-07-05 MED ORDER — EUCERIN EX CREA: TOPICAL_CREAM | CUTANEOUS | 0 refills | Status: AC | PRN

## 2021-07-05 MED ORDER — HYDROCORTISONE 0.5 % EX CREA: 1.0000 "application " | TOPICAL_CREAM | Freq: Two times a day (BID) | CUTANEOUS | 0 refills | Status: DC

## 2021-07-05 NOTE — Patient Instructions (Addendum)
Follow-up with primary care provider as needed Take medications as prescribed Reports new or worsening symptoms to the clinic or go to the emergency room for life-threatening situation. 4.  Has 5 refills of his Albuterol

## 2021-07-05 NOTE — Progress Notes (Signed)
Jorge Williams, is a 35 y.o. male  LKT:[id number]  DSK:[id number]  DOB - 01/08/1986  Subjective:  Chief Complaint and HPI: Jorge Williams is a 35 y.o. male here today with complaints of rash to both forearms and thighs.  Patient was last seen at the ER on Tuesday, July 02, 2022 with the same symptoms including sore throat.  Patient was prescribed Atarax and was discharged home.  Reports that the medication helps with itching, but still has the rash.  Believes this is was due to NyQuil he had been taking prior to the eruption of his symptoms.  Hasn't taken any since then.  Denies any new detergent, clothing, or any new products.  No family history of psoriasis or eczema.  Patient denies being sexually active, his last intercourse was early this year with a male partner and he used a condom.  Denies any swollen lymph nodes, discharge penis, fever, chills, runny nose, headaches, or any other symptom than above.   ED/Hospital notes reviewed.   Social History: Reviewed Family history: Reviewed  ROS:   Constitutional:  No f/c, No night sweats, No unexplained weight loss. EENT:  No vision changes, No blurry vision, No hearing changes. No mouth, throat, or ear problems.  Respiratory: No cough, No SOB Cardiac: No CP, no palpitations GI:  No abd pain, No N/V/D. GU: No Urinary s/sx Musculoskeletal: No joint pain Neuro: No headache, no dizziness, no motor weakness.  Skin: Rash to both forearms and thighs. Endocrine:  No polydipsia. No polyuria.  Psych: Denies SI/HI  No problems updated.  ALLERGIES: Allergies  Allergen Reactions   Apple Itching    Apples make the gums itch   Other Itching    PAST MEDICAL HISTORY: Past Medical History:  Diagnosis Date   Asthma 1994   Bronchitis 1997   Pneumonia 2013    MEDICATIONS AT HOME: Prior to Admission medications   Medication Sig Start Date End Date Taking? Authorizing Provider  albuterol (PROVENTIL) (2.5 MG/3ML) 0.083% nebulizer  solution Take 3 mLs (2.5 mg total) by nebulization every 6 (six) hours as needed for wheezing or shortness of breath. 02/11/20  Yes Yu, Amy V, PA-C  albuterol (VENTOLIN HFA) 108 (90 Base) MCG/ACT inhaler Inhale 2 puffs into the lungs every 4 (four) hours as needed for wheezing or shortness of breath (cough, shortness of breath or wheezing.). 08/02/20  Yes Arvilla Market, MD  hydrocortisone cream 0.5 % Apply 1 application topically 2 (two) times daily. 07/05/21  Yes Eleonore Chiquito, FNP  Skin Protectants, Misc. (EUCERIN) cream Apply topically as needed for dry skin. 07/05/21  Yes Eleonore Chiquito, FNP  acetaminophen (TYLENOL) 500 MG tablet Take 1,000 mg by mouth every 6 (six) hours as needed for headache (pain). Patient not taking: Reported on 07/05/2021    [provider]  FLUoxetine (PROZAC) 20 MG tablet Take 1 tablet (20 mg total) by mouth daily. Patient not taking: Reported on 07/05/2021 08/02/20   Arvilla Market, MD  fluticasone [facility]) 50 MCG/ACT nasal spray Place 2 sprays into both nostrils daily. Patient not taking: Reported on 07/05/2021 02/11/20   Belinda Fisher, PA-C  fluticasone [facility]) 44 MCG/ACT inhaler Inhale 2 puffs twice daily Patient not taking: Reported on 07/05/2021 08/02/20   Arvilla Market, MD  hydrOXYzine (ATARAX) 25 MG tablet Take 1 tablet (25 mg total) by mouth every 6 (six) hours. Patient not taking: Reported on 07/05/2021 07/02/21   Gailen Shelter, PA     Objective:  EXAM:  Vitals:   07/05/21 0846  BP: 108/61  Pulse: 63  Resp: 16  Temp: 98.2 F (36.8 C)  TempSrc: Oral  SpO2: 98%  Weight: 131 lb 12.8 oz (59.8 kg)    General appearance : A&OX3. NAD. Non-toxic-appearing HEENT: Atraumatic and Normocephalic.  PERRLA. EOM intact.  TM clear B. Mouth-MMM, post pharynx WNL w/o erythema, No PND. Neck: supple, no JVD. No cervical lymphadenopathy. No thyromegaly Chest/Lungs:  Breathing-non-labored, Good air entry bilaterally,  breath sounds normal without rales, rhonchi, or wheezing  CVS: S1 S2 regular, no murmurs, gallops, rubs  Abdomen: Bowel sounds present, Non tender and not distended with no gaurding, rigidity or rebound. Extremities: Bilateral Lower Ext shows no edema, both legs are warm to touch with = pulse throughout Neurology:  CN II-XII grossly intact, Non focal.   Psych:  TP linear. J/I WNL. Normal speech. Appropriate eye contact and affect.  Skin: Dry papules to both forearms.  Also dry skin.  Exam to thighs deferred.   Data Review    Assessment & Plan   1. Rash and nonspecific skin eruption  - Skin Protectants, Misc. (EUCERIN) cream; Apply topically as needed for dry skin.  Dispense: 454 g; Refill: 0 - hydrocortisone cream 0.5 %; Apply 1 application topically 2 (two) times daily.  Dispense: 30 g; Refill: 0 -Follow-up with primary care provider within 7 to 14 days if no improvement in symptoms.  At this point we will proceed with lab work including STD panel and others.   The patient was given clear instructions to go to ER or return to medical center if symptoms don't improve, worsen or new problems develop. The patient verbalized understanding. Ellie Lunch, APRN, FNP-C [facility] and [facility] [place], [place] [phone]   07/05/2021, 9:41 AM

## 2021-07-05 NOTE — Progress Notes (Signed)
Patient has a rash on his arms and legs.Patient was seen at ER and given medication. Patient is here today to see what's causing the rash. No other concerns today

## 2021-07-18 ENCOUNTER — Ambulatory Visit: Payer: Self-pay | Admitting: Family Medicine

## 2022-03-23 ENCOUNTER — Observation Stay (HOSPITAL_COMMUNITY)
Admission: EM | Admit: 2022-03-23 | Discharge: 2022-03-24 | Disposition: A | Discharge status: Home / Self Care | Payer: Self-pay | Attending: Emergency Medicine | Admitting: Emergency Medicine

## 2022-03-23 ENCOUNTER — Other Ambulatory Visit: Payer: Self-pay

## 2022-03-23 ENCOUNTER — Encounter (HOSPITAL_COMMUNITY): Payer: Self-pay

## 2022-03-23 ENCOUNTER — Emergency Department (HOSPITAL_COMMUNITY): Payer: Self-pay

## 2022-03-23 DIAGNOSIS — F32A Depression, unspecified: Secondary | ICD-10-CM | POA: Diagnosis present

## 2022-03-23 DIAGNOSIS — F419 Anxiety disorder, unspecified: Secondary | ICD-10-CM | POA: Diagnosis present

## 2022-03-23 DIAGNOSIS — Z87891 Personal history of nicotine dependence: Secondary | ICD-10-CM | POA: Insufficient documentation

## 2022-03-23 DIAGNOSIS — J4530 Mild persistent asthma, uncomplicated: Secondary | ICD-10-CM

## 2022-03-23 DIAGNOSIS — J45901 Unspecified asthma with (acute) exacerbation: Principal | ICD-10-CM | POA: Diagnosis present

## 2022-03-23 DIAGNOSIS — Z20822 Contact with and (suspected) exposure to covid-19: Secondary | ICD-10-CM | POA: Insufficient documentation

## 2022-03-23 DIAGNOSIS — F1210 Cannabis abuse, uncomplicated: Secondary | ICD-10-CM

## 2022-03-23 DIAGNOSIS — Z720 Tobacco use: Secondary | ICD-10-CM

## 2022-03-23 DIAGNOSIS — Z79899 Other long term (current) drug therapy: Secondary | ICD-10-CM | POA: Insufficient documentation

## 2022-03-23 LAB — CBC WITH DIFFERENTIAL/PLATELET
Abs Immature Granulocytes: 0.03 10*3/uL (ref 0.00–0.07)
Basophils Absolute: 0 10*3/uL (ref 0.0–0.1)
Basophils Relative: 0 %
Eosinophils Absolute: 0.1 10*3/uL (ref 0.0–0.5)
Eosinophils Relative: 1 %
HCT: 40.5 % (ref 39.0–52.0)
Hemoglobin: 13.5 g/dL (ref 13.0–17.0)
Immature Granulocytes: 0 %
Lymphocytes Relative: 17 %
Lymphs Abs: 1.7 10*3/uL (ref 0.7–4.0)
MCH: 30.9 pg (ref 26.0–34.0)
MCHC: 33.3 g/dL (ref 30.0–36.0)
MCV: 92.7 fL (ref 80.0–100.0)
Monocytes Absolute: 0.7 10*3/uL (ref 0.1–1.0)
Monocytes Relative: 7 %
Neutro Abs: 7.6 10*3/uL (ref 1.7–7.7)
Neutrophils Relative %: 75 %
Platelets: 301 10*3/uL (ref 150–400)
RBC: 4.37 MIL/uL (ref 4.22–5.81)
RDW: 13.2 % (ref 11.5–15.5)
WBC: 10.2 10*3/uL (ref 4.0–10.5)
nRBC: 0 % (ref 0.0–0.2)

## 2022-03-23 LAB — RESP PANEL BY RT-PCR (FLU A&B, COVID) ARPGX2
Influenza A by PCR: NEGATIVE
Influenza B by PCR: NEGATIVE
SARS Coronavirus 2 by RT PCR: NEGATIVE

## 2022-03-23 LAB — BASIC METABOLIC PANEL
Anion gap: 10 (ref 5–15)
BUN: 16 mg/dL (ref 6–20)
CO2: 21 mmol/L — ABNORMAL LOW (ref 22–32)
Calcium: 9.4 mg/dL (ref 8.9–10.3)
Chloride: 109 mmol/L (ref 98–111)
Creatinine, Ser: 0.92 mg/dL (ref 0.61–1.24)
GFR, Estimated: >60 mL/min (ref >60)
Glucose, Bld: 131 mg/dL — ABNORMAL HIGH (ref 70–99)
Potassium: 3 mmol/L — ABNORMAL LOW (ref 3.5–5.1)
Sodium: 140 mmol/L (ref 135–145)

## 2022-03-23 MED ORDER — METHYLPREDNISOLONE SODIUM SUCC 40 MG IJ SOLR
40.0000 mg | Freq: Two times a day (BID) | INTRAMUSCULAR | Status: AC
  Administered 2022-03-23 – 2022-03-24 (×2): 40 mg via INTRAVENOUS
  Filled 2022-03-23 (×2): qty 1

## 2022-03-23 MED ORDER — EPINEPHRINE 0.3 MG/0.3ML IJ SOAJ
0.3000 mg | Freq: Once | INTRAMUSCULAR | Status: AC
  Administered 2022-03-23: 0.3 mg via INTRAMUSCULAR
  Filled 2022-03-23: qty 0.3

## 2022-03-23 MED ORDER — ACETAMINOPHEN 325 MG PO TABS: 650.0000 mg | ORAL_TABLET | Freq: Four times a day (QID) | ORAL | Status: DC | PRN

## 2022-03-23 MED ORDER — ALBUTEROL SULFATE (2.5 MG/3ML) 0.083% IN NEBU
10.0000 mg/h | INHALATION_SOLUTION | Freq: Once | RESPIRATORY_TRACT | Status: AC
  Administered 2022-03-23: 10 mg/h via RESPIRATORY_TRACT
  Filled 2022-03-23: qty 3

## 2022-03-23 MED ORDER — ALBUTEROL SULFATE (2.5 MG/3ML) 0.083% IN NEBU
2.5000 mg | INHALATION_SOLUTION | Freq: Four times a day (QID) | RESPIRATORY_TRACT | Status: DC
  Administered 2022-03-23 – 2022-03-24 (×4): 2.5 mg via RESPIRATORY_TRACT
  Filled 2022-03-23 (×4): qty 3

## 2022-03-23 MED ORDER — ENOXAPARIN SODIUM 40 MG/0.4ML IJ SOSY
40.0000 mg | PREFILLED_SYRINGE | INTRAMUSCULAR | Status: DC
  Administered 2022-03-23: 40 mg via SUBCUTANEOUS
  Filled 2022-03-23: qty 0.4

## 2022-03-23 MED ORDER — ACETAMINOPHEN 650 MG RE SUPP: 650.0000 mg | Freq: Four times a day (QID) | RECTAL | Status: DC | PRN

## 2022-03-23 MED ORDER — INFLUENZA VAC SPLIT QUAD 0.5 ML IM SUSY: 0.5000 mL | PREFILLED_SYRINGE | INTRAMUSCULAR | Status: DC

## 2022-03-23 MED ORDER — STERILE WATER FOR INJECTION IJ SOLN
INTRAMUSCULAR | Status: AC
  Administered 2022-03-23: 1 mL
  Filled 2022-03-23: qty 10

## 2022-03-23 MED ORDER — PREDNISONE 20 MG PO TABS: 40.0000 mg | ORAL_TABLET | Freq: Every day | ORAL | Status: DC

## 2022-03-23 NOTE — ED Provider Notes (Signed)
[facility]-EMERGENCY DEPT Provider Note   CSN: [id number] Arrival date & time: 03/23/22  1338     History  Chief Complaint  Patient presents with   Shortness of Breath    Jorge Williams. is a 36 y.o. male.   Shortness of Breath    Patient with history of asthma presents by EMS due to asthma exacerbation/shortness of breath.  Patient was tripoding and 90% room air, given 15 total albuterol, 1 Atrovent, 125 Solu-Medrol and 2 g of mag in route by EMS.  Improved to 99% on room air upon arrival.  Patient symptoms started yesterday, worsened today.  Denies any chest pain has been feeling very short of breath.  Denies prodromal fevers, chills.  Home Medications Prior to Admission medications   Medication Sig Start Date End Date Taking? Authorizing Provider  acetaminophen (TYLENOL) 500 MG tablet Take 1,000 mg by mouth every 6 (six) hours as needed for headache (pain). Patient not taking: Reported on 07/05/2021    [provider]  albuterol (PROVENTIL) (2.5 MG/3ML) 0.083% nebulizer solution Take 3 mLs (2.5 mg total) by nebulization every 6 (six) hours as needed for wheezing or shortness of breath. 02/11/20   Cathie Hoops, Amy V, PA-C  albuterol (VENTOLIN HFA) 108 (90 Base) MCG/ACT inhaler Inhale 2 puffs into the lungs every 4 (four) hours as needed for wheezing or shortness of breath (cough, shortness of breath or wheezing.). 08/02/20   Arvilla Market, MD  FLUoxetine (PROZAC) 20 MG tablet Take 1 tablet (20 mg total) by mouth daily. Patient not taking: Reported on 07/05/2021 08/02/20   Arvilla Market, MD  fluticasone [facility]) 50 MCG/ACT nasal spray Place 2 sprays into both nostrils daily. Patient not taking: Reported on 07/05/2021 02/11/20   Belinda Fisher, PA-C  fluticasone [facility]) 44 MCG/ACT inhaler Inhale 2 puffs twice daily Patient not taking: Reported on 07/05/2021 08/02/20   Arvilla Market, MD  hydrocortisone cream 0.5 % Apply 1  application topically 2 (two) times daily. 07/05/21   Eleonore Chiquito, FNP  hydrOXYzine (ATARAX) 25 MG tablet Take 1 tablet (25 mg total) by mouth every 6 (six) hours. Patient not taking: Reported on 07/05/2021 07/02/21   Gailen Shelter, PA  Skin Protectants, Misc. (EUCERIN) cream Apply topically as needed for dry skin. 07/05/21   Eleonore Chiquito, FNP      Allergies    Apple juice and Other    Review of Systems   Review of Systems  Respiratory:  Positive for shortness of breath.     Physical Exam Updated Vital Signs BP (!) 144/87   Pulse 90   Temp 98.1 F (36.7 C) (Oral)   Resp 16   SpO2 99%  Physical Exam Vitals and nursing note reviewed. Exam conducted with a chaperone present.  Constitutional:      Appearance: Normal appearance. He is ill-appearing.  HENT:     Head: Normocephalic and atraumatic.  Eyes:     General: No scleral icterus.       Right eye: No discharge.        Left eye: No discharge.     Extraocular Movements: Extraocular movements intact.     Pupils: Pupils are equal, round, and reactive to light.  Cardiovascular:     Rate and Rhythm: Regular rhythm. Tachycardia present.     Pulses: Normal pulses.     Heart sounds: Normal heart sounds. No murmur heard.    No friction rub. No gallop.  Pulmonary:  Effort: Pulmonary effort is normal. Tachypnea present. No respiratory distress.     Breath sounds: Wheezing present.  Abdominal:     General: Abdomen is flat. Bowel sounds are normal. There is no distension.     Palpations: Abdomen is soft.     Tenderness: There is no abdominal tenderness.  Musculoskeletal:     Right lower leg: No edema.     Left lower leg: No edema.  Skin:    General: Skin is warm and dry.     Coloration: Skin is not jaundiced.  Neurological:     Mental Status: He is alert. Mental status is at baseline.     Coordination: Coordination normal.     ED Results / Procedures / Treatments   Labs (all labs ordered are listed, but  only abnormal results are displayed) Labs Reviewed  BASIC METABOLIC PANEL - Abnormal; Notable for the following components:      Result Value   Potassium 3.0 (*)    CO2 21 (*)    Glucose, Bld 131 (*)    All other components within normal limits  RESP PANEL BY RT-PCR (FLU A&B, COVID) [id number]  CBC WITH DIFFERENTIAL/PLATELET    EKG EKG Interpretation  Date/Time:  Sunday March 23 2022 13:51:19 EDT Ventricular Rate:  97 PR Interval:  168 QRS Duration: 102 QT Interval:  377 QTC Calculation: 479 R Axis:   105 Text Interpretation: Sinus rhythm Consider left ventricular hypertrophy Confirmed by Gloris Manchester [phone]) on 03/23/2022 1:54:02 PM  Radiology DG Chest Portable 1 View  Result Date: 03/23/2022 CLINICAL DATA:  Short of breath EXAM: PORTABLE CHEST 1 VIEW COMPARISON:  07/08/2017 FINDINGS: The heart size and mediastinal contours are within normal limits. Both lungs are clear. The visualized skeletal structures are unremarkable. IMPRESSION: No active disease. Electronically Signed   By: Marlan Palau M.D.   On: 03/23/2022 14:13    Procedures .Critical Care  Performed by: Theron Arista, PA-C Authorized by: Theron Arista, PA-C   Critical care provider statement:    Critical care time (minutes):  31   Critical care start time:  03/23/2022 3:05 PM   Critical care end time:  03/23/2022 3:36 PM   Critical care was necessary to treat or prevent imminent or life-threatening deterioration of the following conditions:  Respiratory failure   Critical care was time spent personally by me on the following activities:  Development of treatment plan with patient or surrogate, discussions with consultants, evaluation of patient's response to treatment, examination of patient, ordering and review of laboratory studies, ordering and review of radiographic studies, ordering and performing treatments and interventions, pulse oximetry, re-evaluation of patient's condition and review of old charts   I assumed  direction of critical care for this patient from another provider in my specialty: no     Care discussed with: admitting provider       Medications Ordered in ED Medications  albuterol (PROVENTIL) (2.5 MG/3ML) 0.083% nebulizer solution (10 mg/hr Nebulization Given 03/23/22 1400)  EPINEPHrine (EPI-PEN) injection 0.3 mg (0.3 mg Intramuscular Given 03/23/22 1523)    ED Course/ Medical Decision Making/ A&P                           Medical Decision Making Amount and/or Complexity of Data Reviewed Labs: ordered. Radiology: ordered.  Risk Prescription drug management. Decision regarding hospitalization.   Patient presents due to shortness of breath.  Differential includes but not limited to asthma exacerbation, PE, pneumonia, ACS.  On exam patient has diffuse wheezing, he is tripoding and tachypneic.  Mildly tachycardic with a regular rhythm.  I reviewed external medical records.  I also spoke with EMS as an independent historian.  Reviewed EMS note as well as nursing notes.  I ordered, viewed and interpreted laboratory work-up. CBC without leukocytosis or anemia. BMP with mild hypokalemia 3.0.  No AKI. COVID and flu negative.  No I ordered and viewed and interpreted chest x-ray which is negative for any acute process.  I agree with radiologist interpretation.  I ordered and viewed EKG which shows sinus rhythm without any acute ischemic findings.  Patient is on cardiac monitoring is in sinus rhythm in the room, interpretation.  Patient has had the following medications prior to arrival: 15 total of Albuterol  1 atrovent 125 solumedrol 2gms Mag  I ordered 1 hour Proventil nebulization.  Nebulizer treatment.  I reevaluated the patient he was still wheezing on exam and tachypneic.  Epinephrine ordered.  Initially considered sending the patient home but given the severity of his asthma exacerbation I do think he needs admission.  I have consulted with hospitalist Dr. Marylyn Ishihara who agrees  with admission.  Discussed HPI, physical exam and plan of care for this patient with attending Godfrey Pick. The attending physician evaluated this patient as part of a shared visit and agrees with plan of care.         Final Clinical Impression(s) / ED Diagnoses Final diagnoses:  Moderate asthma with acute exacerbation, unspecified whether persistent    Rx / DC Orders ED Discharge Orders     None         Sherrill Raring, Hershal Coria 03/23/22 1554    Godfrey Pick, MD 03/23/22 2147

## 2022-03-23 NOTE — H&P (Signed)
History and Physical    Patient: Jorge Williams. ZMO:[id number] DOB: [birth year] DOA: 03/23/2022 DOS: the patient was seen and examined on 03/23/2022 PCP: Dorna Mai, MD  Patient coming from: Home  Chief Complaint:  Chief Complaint  Patient presents with   Shortness of Breath   HPI: Jorge Williams. is a 36 y.o. male with medical history significant of depression, asthma. Presenting with shortness of breath. He was in his normal state of health until yesterday. He started developing dry cough during the day. He didn't have any fever. He didn't have any sick contacts. By night time, he was finding it difficult to breathe. His inhalers didn't seem to work. He couldn't sleep. This morning, he tried 3 rounds of nebulizer but they did not provide complete relief. When his symptoms did not improve, he called for EMS. He denies any other aggravating or alleviating factors.   Review of Systems: As mentioned in the history of present illness. All other systems reviewed and are negative. Past Medical History:  Diagnosis Date   Asthma 1994   Bronchitis 1997   Pneumonia 2013   PSHx History reviewed. No pertinent surgical history.  Social History:  reports that he has quit smoking. His smoking use included cigars. He has never used smokeless tobacco. He reports current alcohol use. He reports current drug use. Frequency: 7.00 times per week. Drug: Marijuana.  Allergies  Allergen Reactions   Apple Juice Itching    Apples make the gums itch   Other Itching    Family History  Problem Relation Age of Onset   Hypertension Mother    Healthy Father    Hypertension Father    Healthy Sister    Multiple sclerosis Brother    Stroke Maternal Grandmother    Hypertension Maternal Grandmother    Hypertension Maternal Grandfather    COPD Paternal Grandmother    Asthma Paternal Grandmother     Prior to Admission medications   Medication Sig Start Date End Date Taking? Authorizing  Provider  acetaminophen (TYLENOL) 500 MG tablet Take 1,000 mg by mouth every 6 (six) hours as needed for headache (pain). Patient not taking: Reported on 07/05/2021    [provider]  albuterol (PROVENTIL) (2.5 MG/3ML) 0.083% nebulizer solution Take 3 mLs (2.5 mg total) by nebulization every 6 (six) hours as needed for wheezing or shortness of breath. 02/11/20   Tasia Catchings, Amy V, PA-C  albuterol (VENTOLIN HFA) 108 (90 Base) MCG/ACT inhaler Inhale 2 puffs into the lungs every 4 (four) hours as needed for wheezing or shortness of breath (cough, shortness of breath or wheezing.). 08/02/20   Nicolette Bang, MD  FLUoxetine (PROZAC) 20 MG tablet Take 1 tablet (20 mg total) by mouth daily. Patient not taking: Reported on 07/05/2021 08/02/20   Nicolette Bang, MD  fluticasone [facility]) 50 MCG/ACT nasal spray Place 2 sprays into both nostrils daily. Patient not taking: Reported on 07/05/2021 02/11/20   Ok Edwards, PA-C  fluticasone [facility]) 44 MCG/ACT inhaler Inhale 2 puffs twice daily Patient not taking: Reported on 07/05/2021 08/02/20   Nicolette Bang, MD  hydrocortisone cream 0.5 % Apply 1 application topically 2 (two) times daily. 07/05/21   Feliberto Gottron, FNP  hydrOXYzine (ATARAX) 25 MG tablet Take 1 tablet (25 mg total) by mouth every 6 (six) hours. Patient not taking: Reported on 07/05/2021 07/02/21   Tedd Sias, PA  Skin Protectants, Misc. (EUCERIN) cream Apply topically as needed for dry skin. 07/05/21  Eleonore Chiquito, FNP    Physical Exam: Vitals:   03/23/22 1349 03/23/22 1415 03/23/22 1430  BP: (!) 143/75 136/75 134/67  Pulse: 96 98 100  Resp: 16 19 14   Temp: 98.1 F (36.7 C)    TempSrc: Oral    SpO2: 100% 100% 100%   General: 36 y.o. male resting in bed in NAD Eyes: PERRL, normal sclera ENMT: Nares patent w/o discharge, orophaynx clear, dentition normal, ears w/o discharge/lesions/ulcers Neck: Supple, trachea midline Cardiovascular: RRR,  +S1, S2, no m/g/r, equal pulses throughout Respiratory: insp/exp wheeze, no r/r, slightly increased WOB GI: BS+, NDNT, no masses noted, no organomegaly noted MSK: No e/c/c Neuro: A&O x 3, no focal deficits Psyc: Appropriate interaction and affect, calm/cooperative  Data Reviewed:  Results for orders placed or performed during the hospital encounter of 03/23/22 (from the past 24 hour(s))  CBC with Differential     Status: None   Collection Time: 03/23/22  1:58 PM  Result Value Ref Range   WBC 10.2 4.0 - 10.5 K/uL   RBC 4.37 4.22 - 5.81 MIL/uL   Hemoglobin 13.5 13.0 - 17.0 g/dL   HCT 03/25/22 84.1 - 66.0 %   MCV 92.7 80.0 - 100.0 fL   MCH 30.9 26.0 - 34.0 pg   MCHC 33.3 30.0 - 36.0 g/dL   RDW 63.0 16.0 - 10.9 %   Platelets 301 150 - 400 K/uL   nRBC 0.0 0.0 - 0.2 %   Neutrophils Relative % 75 %   Neutro Abs 7.6 1.7 - 7.7 K/uL   Lymphocytes Relative 17 %   Lymphs Abs 1.7 0.7 - 4.0 K/uL   Monocytes Relative 7 %   Monocytes Absolute 0.7 0.1 - 1.0 K/uL   Eosinophils Relative 1 %   Eosinophils Absolute 0.1 0.0 - 0.5 K/uL   Basophils Relative 0 %   Basophils Absolute 0.0 0.0 - 0.1 K/uL   Immature Granulocytes 0 %   Abs Immature Granulocytes 0.03 0.00 - 0.07 K/uL   CXR: No active disease.  Assessment and Plan: Asthma exacerbation     - place in obs, tele     - continue nebs, steroids     - CXR negative   Depression     - continue home regimen when confirmed  Tobacco abuse Marijuana abuse     - counsel against further use  Advance Care Planning:   Code Status: FULL  Consults: None  Family Communication: None at bedside  Severity of Illness: The appropriate patient status for this patient is OBSERVATION. Observation status is judged to be reasonable and necessary in order to provide the required intensity of service to ensure the patient's safety. The patient's presenting symptoms, physical exam findings, and initial radiographic and laboratory data in the context of their  medical condition is felt to place them at decreased risk for further clinical deterioration. Furthermore, it is anticipated that the patient will be medically stable for discharge from the hospital within 2 midnights of admission.   Time spent in coordination of this H&P: 55 minutes  Author: 32.3, DO 03/23/2022 3:19 PM  For on call review [email or web address].

## 2022-03-23 NOTE — ED Triage Notes (Signed)
EMS reports from home, asthma exacerbation, SOB. Hx of same. Pt tripoding and Sp02 90 on room air. Neb enroute with improvement, States wheezing on all fields. Gave duoneb as well with improvement.  BP 138/92 HR 110 RR 24 Sp02 99 8lts aerosol max W/treatment  20 LAC  15 total of Albuterol  1 atrovent 125 solumedrol 2gms Mag

## 2022-03-24 ENCOUNTER — Other Ambulatory Visit (HOSPITAL_COMMUNITY): Payer: Self-pay | Admitting: Emergency Medicine

## 2022-03-24 ENCOUNTER — Other Ambulatory Visit: Payer: Self-pay | Admitting: Internal Medicine

## 2022-03-24 ENCOUNTER — Other Ambulatory Visit (HOSPITAL_COMMUNITY): Payer: Self-pay

## 2022-03-24 DIAGNOSIS — F419 Anxiety disorder, unspecified: Secondary | ICD-10-CM

## 2022-03-24 LAB — COMPREHENSIVE METABOLIC PANEL
ALT: 12 U/L (ref 0–44)
AST: 19 U/L (ref 15–41)
Albumin: 4.3 g/dL (ref 3.5–5.0)
Alkaline Phosphatase: 51 U/L (ref 38–126)
Anion gap: 8 (ref 5–15)
BUN: 17 mg/dL (ref 6–20)
CO2: 23 mmol/L (ref 22–32)
Calcium: 10 mg/dL (ref 8.9–10.3)
Chloride: 110 mmol/L (ref 98–111)
Creatinine, Ser: 0.9 mg/dL (ref 0.61–1.24)
GFR, Estimated: >60 mL/min (ref >60)
Glucose, Bld: 137 mg/dL — ABNORMAL HIGH (ref 70–99)
Potassium: 4.6 mmol/L (ref 3.5–5.1)
Sodium: 141 mmol/L (ref 135–145)
Total Bilirubin: 0.3 mg/dL (ref 0.3–1.2)
Total Protein: 7.6 g/dL (ref 6.5–8.1)

## 2022-03-24 LAB — CBC
HCT: 38.5 % — ABNORMAL LOW (ref 39.0–52.0)
Hemoglobin: 13 g/dL (ref 13.0–17.0)
MCH: 30.4 pg (ref 26.0–34.0)
MCHC: 33.8 g/dL (ref 30.0–36.0)
MCV: 90 fL (ref 80.0–100.0)
Platelets: 293 K/uL (ref 150–400)
RBC: 4.28 MIL/uL (ref 4.22–5.81)
RDW: 13.6 % (ref 11.5–15.5)
WBC: 7.6 K/uL (ref 4.0–10.5)
nRBC: 0 % (ref 0.0–0.2)

## 2022-03-24 MED ORDER — ALBUTEROL SULFATE HFA 108 (90 BASE) MCG/ACT IN AERS
2.0000 | INHALATION_SPRAY | RESPIRATORY_TRACT | 5 refills | Status: DC | PRN
  Filled 2022-03-24: qty 6.7, 17d supply, fill #0

## 2022-03-24 MED ORDER — ALBUTEROL SULFATE (2.5 MG/3ML) 0.083% IN NEBU: 2.5000 mg | INHALATION_SOLUTION | RESPIRATORY_TRACT | Status: DC | PRN

## 2022-03-24 MED ORDER — PREDNISONE 10 MG PO TABS
ORAL_TABLET | ORAL | 0 refills | Status: AC
  Filled 2022-03-24: qty 20, 8d supply, fill #0

## 2022-03-24 MED ORDER — FLUTICASONE PROPIONATE HFA 44 MCG/ACT IN AERO
INHALATION_SPRAY | RESPIRATORY_TRACT | 0 refills | Status: AC
  Filled 2022-03-24: qty 10.6, 30d supply, fill #0

## 2022-03-24 MED ORDER — BUDESONIDE 0.25 MG/2ML IN SUSP
0.2500 mg | Freq: Two times a day (BID) | RESPIRATORY_TRACT | Status: DC
  Administered 2022-03-24: 0.25 mg via RESPIRATORY_TRACT
  Filled 2022-03-24: qty 2

## 2022-03-24 MED ORDER — ALBUTEROL SULFATE (2.5 MG/3ML) 0.083% IN NEBU
2.5000 mg | INHALATION_SOLUTION | Freq: Four times a day (QID) | RESPIRATORY_TRACT | 0 refills | Status: DC | PRN
  Filled 2022-03-24: qty 90, 8d supply, fill #0

## 2022-03-24 NOTE — TOC Initial Note (Addendum)
Transition of Care (TOC) - Initial/Assessment Note    Patient Details  Name: Jorge Williams. MRN: [id number] Date of Birth: 20-Jul-1985  Transition of Care [facility]) CM/SW Contact:    Otelia Santee, LCSW Phone Number: 03/24/2022, 9:49 AM  Clinical Narrative:                 TOC consulted for PCP and medication assistance needs as pt has no insurance. Pt reports that he does have a PCP however, his provider at the practice left and he will be seeing a new provider. Pt reports he does struggle with affording his medications at times and has used [facility] in the past to obtain his medications. CSW encouraged pt to continue to use this pharmacy and to bring income information into the pharmacy to receive assistance with medications. CSW also reviewed MATCH program with pt. CSW and pt will rereview MATCH once discharge medications are known to determine if MATCH will be needed following this hospitalization.   Update 1245: CSW reviewed medications and prices with pt. Pt would like to use [facility] program and is aware of copay amounts and timeline in which to pick up medications. MATCH letter provided to pt. Pt plans to pick up medications from [facility] outpatient pharmacy after leaving the hospital.   Expected Discharge Plan: Home/Self Care Barriers to Discharge: No Barriers Identified   Patient Goals and CMS Choice Patient states their goals for this hospitalization and ongoing recovery are:: To return home   Choice offered to / list presented to : Patient  Expected Discharge Plan and Services Expected Discharge Plan: Home/Self Care In-house Referral: NA Discharge Planning Services: CM Consult Post Acute Care Choice: NA Living arrangements for the past 2 months: Single Family Home                 DME Arranged: N/A DME Agency: NA                  Prior Living Arrangements/Services Living arrangements for the past 2 months: Single Family Home Lives  with:: Self Patient language and need for interpreter reviewed:: Yes Do you feel safe going back to the place where you live?: Yes      Need for Family Participation in Patient Care: No (Comment) Care giver support system in place?: No (comment)   Criminal Activity/Legal Involvement Pertinent to Current Situation/Hospitalization: No - Comment as needed  Activities of Daily Living Home Assistive Devices/Equipment: None ADL Screening (condition at time of admission) Patient's cognitive ability adequate to safely complete daily activities?: Yes Is the patient deaf or have difficulty hearing?: No Does the patient have difficulty seeing, even when wearing glasses/contacts?: No Does the patient have difficulty concentrating, remembering, or making decisions?: No Patient able to express need for assistance with ADLs?: Yes Does the patient have difficulty dressing or bathing?: No Independently performs ADLs?: Yes (appropriate for developmental age) Does the patient have difficulty walking or climbing stairs?: No Weakness of Legs: None Weakness of Arms/Hands: None  Permission Sought/Granted   Permission granted to share information with : No              Emotional Assessment Appearance:: Appears stated age Attitude/Demeanor/Rapport: Engaged, Gracious Affect (typically observed): Accepting, Pleasant Orientation: : Oriented to Self, Oriented to Place, Oriented to  Time, Oriented to Situation Alcohol / Substance Use: Not Applicable Psych Involvement: No (comment)  Admission diagnosis:  Asthma exacerbation [J45.901] Moderate asthma with acute exacerbation, unspecified whether persistent [J45.901]  Patient Active Problem List   Diagnosis Date Noted   Asthma exacerbation 03/23/2022   Tobacco abuse 03/23/2022   Marijuana abuse 03/23/2022   Current severe episode of major depressive disorder without psychotic features without prior episode ([place])    Anxiety and depression 03/24/2019    Family history of systemic lupus erythematosus 03/24/2019   Family history of multiple sclerosis 03/24/2019   Tremor 01/17/2019   Paresthesia 01/17/2019   SOB (shortness of breath) 07/24/2017   PCP:  Dorna Mai, MD Pharmacy:   [facility] ([facility] [place] [address] [place] [address] Phone: [phone] Fax: [phone]     Social Determinants of Health (SDOH) Interventions Food Insecurity Interventions: Patient Refused Utilities Interventions: Intervention Not Indicated  Readmission Risk Interventions     No data to display

## 2022-03-24 NOTE — Hospital Course (Addendum)
36 y.o. male with medical history significant of depression, asthma,presented with shortness of breath, dry cough that did not improve with inhalers/nebulizer so seen in the ED, chest x-ray unremarkable labs stable, was actively wheezing and was admitted for acute asthma exacerbation.  Overall patient is significantly improved, has good air entry bilateral lungs with mild expiratory wheezing, has been ambulating on room air.  He feels stable for discharge home today.

## 2022-03-24 NOTE — Progress Notes (Signed)
  [place] Medication Assistance Card Name: Kholton Coate (MRN): [id number] [place]: [id number] RX Group: [id number] Discharge Date: 03/24/22 Expiration Date:04/01/22                                           (must be filled within 7 days of discharge)     You have been approved to have the prescriptions written by your discharging physician filled through our [facility] (Medication Assistance Through [facility]) program. This program allows for a one-time (no refills) 34-day supply of selected medications for a low copay amount.  The copay is $3.00 per prescription. For instance, if you have one prescription, you will pay $3.00; for two prescriptions, you pay $6.00; for three prescriptions, you pay $9.00; and so on.  Only certain pharmacies are participating in this program with [facility]. You will need to select one of the pharmacies from the attached list and take your prescriptions, this letter, and your photo ID to one of the [place] pharmacies, [facility], [facility] at [address] or [facility] [address].   We are excited that you are able to use the [facility] program to get your medications. These prescriptions must be filled within 7 days of hospital discharge or they will no longer be valid for the [facility] program. Should you have any problems with your prescriptions please contact your case management team member at [phone] for [place]/ [place] you, [facility]

## 2022-03-24 NOTE — Discharge Summary (Signed)
Physician Discharge Summary  Jorge E Criss AlvineJohnson Jr. ZOX:[id number] DOB: March 15, 1986 DOA: 03/23/2022  PCP: Georganna SkeansWilson, Amelia, MD  Admit date: 03/23/2022 Discharge date: 03/24/2022 Recommendations for Outpatient Follow-up:  Follow up with PCP in 1 weeks-call for appointment Please obtain BMP/CBC in one week  Discharge Dispo: home Discharge Condition: Stable Code Status:   Code Status: Full Code Diet recommendation:  Diet Order             Diet regular Room service appropriate? Yes; Fluid consistency: Thin  Diet effective now                    Brief/Interim Summary: 36 y.o. male with medical history significant of depression, asthma,presented with shortness of breath, dry cough that did not improve with inhalers/nebulizer so seen in the ED, chest x-ray unremarkable labs stable, was actively wheezing and was admitted for acute asthma exacerbation.  Overall patient is significantly improved, has good air entry bilateral lungs with mild expiratory wheezing, has been ambulating on room air.  He feels stable for discharge home today.       Discharge Diagnoses:  Principal Problem:   Asthma exacerbation Active Problems:   Anxiety and depression   Tobacco abuse   Marijuana abuse  Acute asthma exacerbation: Chest x-ray unremarkable.  No leukocytosis, given IV Solu-Medrol> to p.o. prednisone, aggressive bronchodilators.  Improved clinically will discharge on oral prednisone taper will give refill for his Flovent/albuterol.  TOC consulted to assist with medication PCP  Depression: Mood stable Tobacco use/Marijuana use: Cessation counseled   Consults: TOC Subjective: Alert awake oriented resting comfortably able to ambulate without shortness of breath.  Discharge Exam: Vitals:   03/24/22 0329 03/24/22 0807  BP: 139/78   Pulse: 70   Resp: 18   Temp: 99.1 F (37.3 C)   SpO2: 95% 96%   General: Pt is alert, awake, not in acute distress Cardiovascular: RRR, S1/S2 +, no rubs, no  gallops Respiratory: CTA bilaterally, no wheezing, no rhonchi Abdominal: Soft, NT, ND, bowel sounds + Extremities: no edema, no cyanosis  Discharge Instructions  Discharge Instructions     Discharge instructions   Complete by: As directed    Please call call MD or return to ER for similar or worsening recurring problem that brought you to hospital or if any fever,nausea/vomiting,abdominal pain, uncontrolled pain, chest pain,  shortness of breath or any other alarming symptoms.  Please follow-up your doctor as instructed in a week time and call the office for appointment.  Please avoid alcohol, smoking, or any other illicit substance and maintain healthy habits including taking your regular medications as prescribed.  You were cared for by a hospitalist during your hospital stay. If you have any questions about your discharge medications or the care you received while you were in the hospital after you are discharged, you can call the unit and ask to speak with the hospitalist on call if the hospitalist that took care of you is not available.  Once you are discharged, your primary care physician will handle any further medical issues. Please note that NO REFILLS for any discharge medications will be authorized once you are discharged, as it is imperative that you return to your primary care physician (or establish a relationship with a primary care physician if you do not have one) for your aftercare needs so that they can reassess your need for medications and monitor your lab values   Increase activity slowly   Complete by: As directed  Allergies as of 03/24/2022       Reactions   Apple Juice Itching   Apples make the gums itch   Other Itching        Medication List     TAKE these medications    albuterol (2.5 MG/3ML) 0.083% nebulizer solution Commonly known as: PROVENTIL Take 3 mLs (2.5 mg total) by nebulization every 6 (six) hours as needed for wheezing or shortness  of breath.   albuterol 108 (90 Base) MCG/ACT inhaler Commonly known as: VENTOLIN HFA Inhale 2 puffs into the lungs every 4 (four) hours as needed for wheezing or shortness of breath (cough, shortness of breath or wheezing.).   aspirin EC 81 MG tablet Take 325 mg by mouth once. Swallow whole.   eucerin cream Apply topically as needed for dry skin.   FLUoxetine 20 MG tablet Commonly known as: PROZAC Take 1 tablet (20 mg total) by mouth daily.   fluticasone 44 MCG/ACT inhaler Commonly known as: Flovent HFA Inhale 2 puffs twice daily   fluticasone 50 MCG/ACT nasal spray Commonly known as: FLONASE Place 2 sprays into both nostrils daily.   hydrocortisone cream 0.5 % Apply 1 application topically 2 (two) times daily.   hydrOXYzine 25 MG tablet Commonly known as: ATARAX Take 1 tablet (25 mg total) by mouth every 6 (six) hours.   predniSONE 10 MG tablet Commonly known as: DELTASONE Take PO 4 tabs daily x 2 days,3 tabs daily x 2 days,2 tabs daily x 2 days,1 tab daily x 2 days then stop.        Follow-up Information     [facility] Follow up.   Why: Bring proof of residency and income to obtain financial assistance with medications from pharmacy. Contact information: [address] [place] [address] [phone]               Allergies  Allergen Reactions   Apple Juice Itching    Apples make the gums itch   Other Itching    The results of significant diagnostics from this hospitalization (including imaging, microbiology, ancillary and laboratory) are listed below for reference.    Microbiology: Recent Results (from the past 240 hour(s))  Resp Panel by RT-PCR (Flu A&B, Covid) Anterior Nasal Swab     Status: None   Collection Time: 03/23/22  1:58 PM   Specimen: Anterior Nasal Swab  Result Value Ref Range Status   SARS Coronavirus 2 by RT PCR NEGATIVE NEGATIVE Final    Comment: (NOTE) SARS-CoV-2  target nucleic acids are NOT DETECTED.  The SARS-CoV-2 RNA is generally detectable in upper respiratory specimens during the acute phase of infection. The lowest concentration of SARS-CoV-2 viral copies this assay can detect is 138 copies/mL. A negative result does not preclude SARS-Cov-2 infection and should not be used as the sole basis for treatment or other patient management decisions. A negative result may occur with  improper specimen collection/handling, submission of specimen other than nasopharyngeal swab, presence of viral mutation(s) within the areas targeted by this assay, and inadequate number of viral copies(<138 copies/mL). A negative result must be combined with clinical observations, patient history, and epidemiological information. The expected result is Negative.  Fact Sheet for Patients:  BloggerCourse.com  Fact Sheet for Healthcare Providers:  SeriousBroker.it  This test is no t yet approved or cleared by the [place] FDA and  has been authorized for detection and/or diagnosis of SARS-CoV-2 by FDA under an Emergency Use  Authorization (EUA). This EUA will remain  in effect (meaning this test can be used) for the duration of the COVID-19 declaration under Section 564(b)(1) of the Act, 21 U.S.C.section 360bbb-3(b)(1), unless the authorization is terminated  or revoked sooner.       Influenza A by PCR NEGATIVE NEGATIVE Final   Influenza B by PCR NEGATIVE NEGATIVE Final    Comment: (NOTE) The Xpert Xpress SARS-CoV-2/FLU/RSV plus assay is intended as an aid in the diagnosis of influenza from Nasopharyngeal swab specimens and should not be used as a sole basis for treatment. Nasal washings and aspirates are unacceptable for Xpert Xpress SARS-CoV-2/FLU/RSV testing.  Fact Sheet for Patients: EntrepreneurPulse.com.au  Fact Sheet for Healthcare  Providers: IncredibleEmployment.be  This test is not yet approved or cleared by the [place] FDA and has been authorized for detection and/or diagnosis of SARS-CoV-2 by FDA under an Emergency Use Authorization (EUA). This EUA will remain in effect (meaning this test can be used) for the duration of the COVID-19 declaration under Section 564(b)(1) of the Act, 21 U.S.C. section 360bbb-3(b)(1), unless the authorization is terminated or revoked.  Performed at [facility], [place] [address], [place], [place] [address]     Procedures/Studies: DG Chest Portable 1 View  Result Date: 03/23/2022 CLINICAL DATA:  Short of breath EXAM: PORTABLE CHEST 1 VIEW COMPARISON:  07/08/2017 FINDINGS: The heart size and mediastinal contours are within normal limits. Both lungs are clear. The visualized skeletal structures are unremarkable. IMPRESSION: No active disease. Electronically Signed   By: Franchot Gallo M.D.   On: 03/23/2022 14:13    Labs: BNP (last 3 results) No results for input(s): "BNP" in the last 8760 hours. Basic Metabolic Panel: Recent Labs  Lab 03/23/22 1358 03/24/22 0455  NA 140 141  K 3.0* 4.6  CL 109 110  CO2 21* 23  GLUCOSE 131* 137*  BUN 16 17  CREATININE 0.92 0.90  CALCIUM 9.4 10.0   Liver Function Tests: Recent Labs  Lab 03/24/22 0455  AST 19  ALT 12  ALKPHOS 51  BILITOT 0.3  PROT 7.6  ALBUMIN 4.3   No results for input(s): "LIPASE", "AMYLASE" in the last 168 hours. No results for input(s): "AMMONIA" in the last 168 hours. CBC: Recent Labs  Lab 03/23/22 1358 03/24/22 0455  WBC 10.2 7.6  NEUTROABS 7.6  --   HGB 13.5 13.0  HCT 40.5 38.5*  MCV 92.7 90.0  PLT 301 293   Cardiac Enzymes: No results for input(s): "CKTOTAL", "CKMB", "CKMBINDEX", "TROPONINI" in the last 168 hours. BNP: Invalid input(s): "POCBNP" CBG: No results for input(s): "GLUCAP" in the last 168 hours. D-Dimer No results for input(s): "DDIMER" in  the last 72 hours. Hgb A1c No results for input(s): "HGBA1C" in the last 72 hours. Lipid Profile No results for input(s): "CHOL", "HDL", "LDLCALC", "TRIG", "CHOLHDL", "LDLDIRECT" in the last 72 hours. Thyroid function studies No results for input(s): "TSH", "T4TOTAL", "T3FREE", "THYROIDAB" in the last 72 hours.  Invalid input(s): "FREET3" Anemia work up No results for input(s): "VITAMINB12", "FOLATE", "FERRITIN", "TIBC", "IRON", "RETICCTPCT" in the last 72 hours. Urinalysis    Component Value Date/Time   COLORURINE YELLOW 06/30/2008 0223   APPEARANCEUR CLOUDY (A) 06/30/2008 0223   LABSPEC 1.029 06/30/2008 0223   PHURINE 6.0 06/30/2008 0223   GLUCOSEU NEGATIVE 06/30/2008 0223   HGBUR NEGATIVE 06/30/2008 0223   BILIRUBINUR NEGATIVE 06/30/2008 0223   KETONESUR NEGATIVE 06/30/2008 0223   PROTEINUR NEGATIVE 06/30/2008 0223   UROBILINOGEN 1.0 06/30/2008 0223  NITRITE NEGATIVE 06/30/2008 0223   LEUKOCYTESUR MODERATE (A) 06/30/2008 0223   Sepsis Labs Recent Labs  Lab 03/23/22 1358 03/24/22 0455  WBC 10.2 7.6   Microbiology Recent Results (from the past 240 hour(s))  Resp Panel by RT-PCR (Flu A&B, Covid) Anterior Nasal Swab     Status: None   Collection Time: 03/23/22  1:58 PM   Specimen: Anterior Nasal Swab  Result Value Ref Range Status   SARS Coronavirus 2 by RT PCR NEGATIVE NEGATIVE Final    Comment: (NOTE) SARS-CoV-2 target nucleic acids are NOT DETECTED.  The SARS-CoV-2 RNA is generally detectable in upper respiratory specimens during the acute phase of infection. The lowest concentration of SARS-CoV-2 viral copies this assay can detect is 138 copies/mL. A negative result does not preclude SARS-Cov-2 infection and should not be used as the sole basis for treatment or other patient management decisions. A negative result may occur with  improper specimen collection/handling, submission of specimen other than nasopharyngeal swab, presence of viral mutation(s) within  the areas targeted by this assay, and inadequate number of viral copies(<138 copies/mL). A negative result must be combined with clinical observations, patient history, and epidemiological information. The expected result is Negative.  Fact Sheet for Patients:  BloggerCourse.com  Fact Sheet for Healthcare Providers:  SeriousBroker.it  This test is no t yet approved or cleared by the [place] FDA and  has been authorized for detection and/or diagnosis of SARS-CoV-2 by FDA under an Emergency Use Authorization (EUA). This EUA will remain  in effect (meaning this test can be used) for the duration of the COVID-19 declaration under Section 564(b)(1) of the Act, 21 U.S.C.section 360bbb-3(b)(1), unless the authorization is terminated  or revoked sooner.       Influenza A by PCR NEGATIVE NEGATIVE Final   Influenza B by PCR NEGATIVE NEGATIVE Final    Comment: (NOTE) The Xpert Xpress SARS-CoV-2/FLU/RSV plus assay is intended as an aid in the diagnosis of influenza from Nasopharyngeal swab specimens and should not be used as a sole basis for treatment. Nasal washings and aspirates are unacceptable for Xpert Xpress SARS-CoV-2/FLU/RSV testing.  Fact Sheet for Patients: BloggerCourse.com  Fact Sheet for Healthcare Providers: SeriousBroker.it  This test is not yet approved or cleared by the [place] FDA and has been authorized for detection and/or diagnosis of SARS-CoV-2 by FDA under an Emergency Use Authorization (EUA). This EUA will remain in effect (meaning this test can be used) for the duration of the COVID-19 declaration under Section 564(b)(1) of the Act, 21 U.S.C. section 360bbb-3(b)(1), unless the authorization is terminated or revoked.  Performed at [facility], [address], [place], [place] [address]      Time coordinating discharge: 25  minutes  SIGNED: Lanae Boast, MD  [facility] 03/24/2022, 11:34 AM  If 7PM-7AM, please contact night-coverage [email or web address]

## 2022-03-25 ENCOUNTER — Telehealth: Payer: Self-pay

## 2022-03-25 NOTE — Telephone Encounter (Signed)
Transition Care Management Follow-up Telephone Call Date of discharge and from where: 03/24/2022, [facility] How have you been since you were released from the hospital? He said he is getting his breathing back.  Any questions or concerns? Yes- he has been using his daughter's nebulizer and would like his own. Dr Redmond Pulling was listed as his PCP but he never saw her, so her name was removed in [place].  He would like to stay at [facility] to establish care.  He does not have the fluoxetine or hydroxyzine and would like those refilled. He said he hasn't taken them in awhile because they were making him drowsy at work.  He will discuss the need for these medications at his [facility] appointment. He reports difficulty affording medications   Items Reviewed: Did the pt receive and understand the discharge instructions provided? Yes  Medications obtained and verified?  He has all medications except those noted above.  Other? No  Any new allergies since your discharge? No  Dietary orders reviewed? No Do you have support at home? Yes - he is staying with his grandmother  [place] and Equipment/Supplies: Were home health services ordered? no If so, what is the name of the agency? N/a  Has the agency set up a time to come to the patient's home? not applicable Were any new equipment or medical supplies ordered?  No What is the name of the medical supply agency? N/a Were you able to get the supplies/equipment? not applicable Do you have any questions related to the use of the equipment or supplies? No  Functional Questionnaire: (I = Independent and D = Dependent) ADLs: independent   Follow up appointments reviewed:  PCP Hospital f/u appt confirmed? Yes  Scheduled to see Durene Fruits, NP - 04/09/2022.   [facility] f/u appt confirmed?  None scheduled yet.   Are transportation arrangements needed? No  If their condition worsens, is the pt aware to call PCP or go to the Emergency Dept.? Yes Was the  patient provided with contact information for the PCP's office or ED? Yes Was to pt encouraged to call back with questions or concerns? Yes

## 2022-03-26 ENCOUNTER — Other Ambulatory Visit (HOSPITAL_COMMUNITY): Payer: Self-pay

## 2022-03-31 ENCOUNTER — Other Ambulatory Visit (HOSPITAL_COMMUNITY): Payer: Self-pay | Admitting: Emergency Medicine

## 2022-03-31 ENCOUNTER — Other Ambulatory Visit (HOSPITAL_COMMUNITY): Payer: Self-pay

## 2022-04-02 NOTE — Progress Notes (Deleted)
TRANSITION OF CARE VISIT   Date of Admission: 03/23/2022  Date of Discharge: 03/24/2022  Transitions of Care Call: 03/25/2022  Discharged from: [facility]   Discharge Diagnosis:  Principal Problem:   Asthma exacerbation Active Problems:   Anxiety and depression   Tobacco abuse   Marijuana abuse  Summary of Admission per MD note:  Recommendations for Outpatient Follow-up:  Follow up with PCP in 1 weeks-call for appointment Please obtain BMP/CBC in one week   Brief/Interim Summary: 36 y.o. male with medical history significant of depression, asthma,presented with shortness of breath, dry cough that did not improve with inhalers/nebulizer so seen in the ED, chest x-ray unremarkable labs stable, was actively wheezing and was admitted for acute asthma exacerbation.  Overall patient is significantly improved, has good air entry bilateral lungs with mild expiratory wheezing, has been ambulating on room air.  He feels stable for discharge home today.   Discharge Diagnoses:  Principal Problem:   Asthma exacerbation Active Problems:   Anxiety and depression   Tobacco abuse   Marijuana abuse   Acute asthma exacerbation: Chest x-ray unremarkable.  No leukocytosis, given IV Solu-Medrol> to p.o. prednisone, aggressive bronchodilators.  Improved clinically will discharge on oral prednisone taper will give refill for his Flovent/albuterol.  TOC consulted to assist with medication PCP   Depression: Mood stable Tobacco use/Marijuana use: Cessation counseled    Today's visit 04/09/2022: Fluoxetine - drowsy Hydroxyzine - drowsy  oral prednisone taper will give refill for his Flovent/albuterol Cost of meds  Patient/Caregiver self-reported problems/concerns: see above  MEDICATIONS  Medication Reconciliation conducted with patient/caregiver? (Yes/ No): yes  New medications prescribed/discontinued upon discharge? (Yes/No):  yes  Barriers identified related to medications: no  LABS  Lab Reviewed (Yes/No/NA): yes  PHYSICAL EXAM:    ASSESSMENT AND PLAN:   PATIENT EDUCATION PROVIDED: See AVS   FOLLOW-UP (Include any further testing or referrals): ***  Patient was given clear instructions to go to Emergency Department or return to medical center if symptoms don't improve, worsen, or new problems develop.The patient verbalized understanding.

## 2022-04-07 ENCOUNTER — Ambulatory Visit: Payer: Self-pay

## 2022-04-07 NOTE — Telephone Encounter (Signed)
No sooner appts at this time.

## 2022-04-07 NOTE — Telephone Encounter (Signed)
Pls call pt wanted sooner appt than 10/4 having bad headaches and states no vision problem but light hurts his eyes/ [phone]   No answer.

## 2022-04-07 NOTE — Telephone Encounter (Signed)
  Chief Complaint: headache Symptoms: headache 10/10, everyday constant, light sensitivity Frequency: HA since 03/24/22 after taking prednisone that ED prescribed, light sensitivity 2-3 days ago Pertinent Negatives: Patient denies blurred vision or eye pain Disposition: [] ED /[] Urgent Care (no appt availability in office) / [x] Appointment(In office/virtual)/ []  [facility]/ [] Home Care/ [] Refused Recommended Disposition /[] [facility]/ []  Follow-up with PCP Additional Notes: pt preferred not to go to UC or ED since he has appt upcoming on 04/09/22 with Amy, NP. Tried to reschedule appt and there was open appt for 04/08/22 at 0820 that pt can do but would not allow NT to schedule. Tried calling [facility] but closed for lunch. Advised pt I would send message back and have someone fu with him if they can schedule appt. Did inform him if he didn't hear anything to come to appt on 04/09/22. Pt verbalized understanding.   Summary: headache sensitive to light   Pls call pt wanted sooner appt than 10/4 having bad headaches and states no vision problem but light hurts his eyes/ [phone]      Reason for Disposition  [1] SEVERE headache (e.g., excruciating) AND [2] not improved after 2 hours of pain medicine  Answer Assessment - Initial Assessment Questions 1. LOCATION: "Where does it hurt?"      L sided pain, front near eye and temporal area  2. ONSET: "When did the headache start?" (Minutes, hours or days)      Ongoing since 03/24/22 3. PATTERN: "Does the pain come and go, or has it been constant since it started?"     Constant, everyday  4. SEVERITY: "How bad is the pain?" and "What does it keep you from doing?"  (e.g., Scale 1-10; mild, moderate, or severe)   - MILD (1-3): doesn't interfere with normal activities    - MODERATE (4-7): interferes with normal activities or awakens from sleep    - SEVERE (8-10): excruciating pain, unable to do any normal activities        10 6. CAUSE:  "What do you think is causing the headache?"     Unsure if r/t prednisone 9. OTHER SYMPTOMS: "Do you have any other symptoms?" (fever, stiff neck, eye pain, sore throat, cold symptoms)     Light sensitivity when HA  Protocols used: Headache-A-AH

## 2022-04-07 NOTE — Progress Notes (Signed)
TRANSITION OF CARE VISIT    Date of Admission: 03/23/2022  Date of Discharge: 03/24/2022  Transitions of Care Call: 03/25/2022  Discharged from: [facility]  Discharge Diagnosis:  Principal Problem:   Asthma exacerbation Active Problems:   Anxiety and depression   Tobacco abuse   Marijuana abuse  Summary of Admission per MD note:  Recommendations for Outpatient Follow-up:  Follow up with PCP in 1 weeks-call for appointment Please obtain BMP/CBC in one week  Acute asthma exacerbation: Chest x-ray unremarkable.  No leukocytosis, given IV Solu-Medrol> to p.o. prednisone, aggressive bronchodilators.  Improved clinically will discharge on oral prednisone taper will give refill for his Flovent/albuterol.  TOC consulted to assist with medication PCP   Depression: Mood stable Tobacco use/Marijuana use: Cessation counseled  04/07/2022 per triage RN note: Chief Complaint: headache Symptoms: headache 10/10, everyday constant, light sensitivity Frequency: HA since 03/24/22 after taking prednisone that ED prescribed, light sensitivity 2-3 days ago Pertinent Negatives: Patient denies blurred vision or eye pain Disposition: [] ED /[] Urgent Care (no appt availability in office) / [x] Appointment(In office/virtual)/ []  [facility]/ [] Home Care/ [] Refused Recommended Disposition /[] [facility]/ []  Follow-up with PCP Additional Notes: pt preferred not to go to UC or ED since he has appt upcoming on 04/09/22 with Arslan Kier, NP. Tried to reschedule appt and there was open appt for 04/08/22 at 0820 that pt can do but would not allow NT to schedule. Tried calling [facility] but closed for lunch. Advised pt I would send message back and have someone fu with him if they can schedule appt. Did inform him if he didn't hear anything to come to appt on 04/09/22. Pt verbalized understanding.       Summary: headache sensitive to light    Pls call  pt wanted sooner appt than 10/4 having bad headaches and states no vision problem but light hurts his eyes/ [phone]       Reason for Disposition  [1] SEVERE headache (e.g., excruciating) AND [2] not improved after 2 hours of pain medicine  Answer Assessment - Initial Assessment Questions 1. LOCATION: "Where does it hurt?"      L sided pain, front near eye and temporal area  2. ONSET: "When did the headache start?" (Minutes, hours or days)      Ongoing since 03/24/22 3. PATTERN: "Does the pain come and go, or has it been constant since it started?"     Constant, everyday  4. SEVERITY: "How bad is the pain?" and "What does it keep you from doing?"  (e.g., Scale 1-10; mild, moderate, or severe)   - MILD (1-3): doesn't interfere with normal activities    - MODERATE (4-7): interferes with normal activities or awakens from sleep    - SEVERE (8-10): excruciating pain, unable to do any normal activities        10 6. CAUSE: "What do you think is causing the headache?"     Unsure if r/t prednisone 9. OTHER SYMPTOMS: "Do you have any other symptoms?" (fever, stiff neck, eye pain, sore throat, cold symptoms)     Light sensitivity when HA  Today's visit 04/08/2022: - Asthma improved since hospital discharge. Using medications as prescribed and helping. He is using his daughter's nebulizer and needs his own. States he is no longer smoking cigarettes. States he was smoking marijuana around 7 to 8 times daily.Currently smoking marijuana around twice daily. Reports smoking marijuana is out of habit and not to get high.  - Has not taken  Fluoxetine or Hydroxyzine in some time due to causing side effects of drowsiness. At that time he was working at [employer] and quit taking. He is currently self employed. Reports thoughts he does not have any active thoughts of suicide, self-harm, or homicide. States "Sometimes I just think it would be better if I wasn't here." States several years ago tried to hang himself  with a belt, considered shooting himself, or jumping off a bridge. Going through possible divorce. States he is trying to determine if he will stay or not. Divorce process complicated by being unable to see his wife's 31 year-old  daughter. He is not the biological father but has been present in her daughter's life since she was 31 years-old. Recalls drinking a bottle of liquor within a few hours recently when having increased stressors. In the past had counseling sessions with our office LSCW and went well and states wished he had of continued with the sessions because it really helped.  - Noticed headaches began after starting Prednisone. He did complete course of Prednisone. Headaches are daily and throbbing with sensitivity to light. Denies red flag symptoms such as but not limited to chest pain, shortness of breath, blurry vision, nausea, and vomiting. Has tried several over-the-counter medications without relief.  - He does need financial assistance. Reports medications were sent to [facility] and has been able to get through transition of care. He is requesting today's prescriptions be sent to the same.  - No further issues or concerns for today.      04/08/2022    8:34 AM 07/05/2021    8:43 AM 08/02/2020    8:54 AM 07/01/2020    1:19 PM 06/05/2020    3:08 PM  Depression screen PHQ 2/9  Decreased Interest 1 0 1 2 2   Down, Depressed, Hopeless 2 1 1 2 3   PHQ - 2 Score 3 1 2 4 5   Altered sleeping 1 0 1 3 2   Tired, decreased energy 1 1 1 1  0  Change in appetite 1 1 1 2 1   Feeling bad or failure about yourself  1 0 1 3 2   Trouble concentrating 0 0 1 0 1  Moving slowly or fidgety/restless 0 0 0 0 0  Suicidal thoughts 1 0 1 2 2   PHQ-9 Score 8 3 8 15 13   Difficult doing work/chores Somewhat difficult Not difficult at all Very difficult Very difficult     Patient/Caregiver self-reported problems/concerns: see above   MEDICATIONS  Medication Reconciliation conducted with  patient/caregiver? (Yes/ No): yes   New medications prescribed/discontinued upon discharge? (Yes/No): yes  Barriers identified related to medications: Needs financial assistance.  LABS  Lab Reviewed (Yes/No/NA): yes  PHYSICAL EXAM:  Physical Exam HENT:     Head: Normocephalic and atraumatic.  Eyes:     Extraocular Movements: Extraocular movements intact.     Conjunctiva/sclera: Conjunctivae normal.     Pupils: Pupils are equal, round, and reactive to light.  Cardiovascular:     Rate and Rhythm: Normal rate and regular rhythm.     Pulses: Normal pulses.     Heart sounds: Normal heart sounds.  Pulmonary:     Effort: Pulmonary effort is normal.     Breath sounds: Normal breath sounds.  Musculoskeletal:     Cervical back: Normal range of motion and neck supple.  Neurological:     General: No focal deficit present.     Mental Status: He is alert and oriented to person, place, and  time.  Psychiatric:        Mood and Affect: Mood normal.        Behavior: Behavior normal.    ASSESSMENT AND PLAN: 1. Hospital discharge follow-up - Reviewed hospital course, current medications, ensured proper follow-up in place, and addressed concerns.  - Update BMP and CBC.  - Basic Metabolic Panel - CBC  2. Exacerbation of asthma, unspecified asthma severity, unspecified whether persistent - Patient reports improved since hospital discharge/. - Continue Albuterol nebulizer and Albuterol inhaler as prescribed.  - Follow-up with primary provider as scheduled.  - albuterol (PROVENTIL) (2.5 MG/3ML) 0.083% nebulizer solution; Take 3 mLs (2.5 mg total) by nebulization every 6 hours as needed for wheezing or shortness of breath.  Dispense: 90 mL; Refill: 5 - albuterol (VENTOLIN HFA) 108 (90 Base) MCG/ACT inhaler; Inhale 2 puffs into the lungs every 4 (four) hours as needed for wheezing or shortness of breath (cough, shortness of breath or wheezing.).  Dispense: 6.7 g; Refill: 5 - For home use only DME  Nebulizer machine  3. Anxiety and depression - Fluoxetine and Hydroxyzine as prescribed. Counseled on medication adherence and adverse effects.  - Counseled patient to take Fluoxetine 2 - 3 hours prior to bedtime and to space taking Hydroxyzine at least 6 hours apart with goal of decreasing side effect of drowsiness. Patient verbalized understanding.  - Referral to Reginia Naas, LCSWA for counseling and community resources.  - Follow-up with primary provider in 4 weeks or sooner if needed.  - FLUoxetine (PROZAC) 20 MG capsule; Take 1 capsule (20 mg total) by mouth daily.  Dispense: 30 capsule; Refill: 2 - hydrOXYzine (ATARAX) 25 MG tablet; Take 1 tablet (25 mg total) by mouth every 6 (six) hours.  Dispense: 30 tablet; Refill: 2  4. Tobacco abuse 5. Marijuana abuse - Patient reports no longer smoking cessation.  - Counseled on cessation of marijuana.   6. Nonintractable headache, unspecified chronicity pattern, unspecified headache type - No neurologic deficits today in office.  - Ketorolac administered in office.  - Sumatriptan as prescribed. Counseled on medication adherence and adverse effects. - Follow-up with primary provider in 2 weeks or sooner if needed.  - ketorolac (TORADOL) injection 60 mg - SUMAtriptan (IMITREX) 50 MG tablet; Take 1 tablet by mouth at the start of the headache. May repeat 1 time in 2 hours if headache persists. (Max of 2 tablets per 24 hours.)  Dispense: 30 tablet; Refill: 2  7. Financial difficulties - Offered patient [facility] financial discount/orange card. Discussed patient will need to mail in completed application for processing and schedule appointment with financial counselor. Patient verbalized understanding.    PATIENT EDUCATION PROVIDED: See AVS   FOLLOW-UP (Include any further testing or referrals):  Follow-up with primary provider in 2 weeks or sooner if needed.   Patient was given clear instructions to go to Emergency Department or return  to medical center if symptoms don't improve, worsen, or new problems develop.The patient verbalized understanding.

## 2022-04-08 ENCOUNTER — Ambulatory Visit (INDEPENDENT_AMBULATORY_CARE_PROVIDER_SITE_OTHER): Payer: Self-pay | Admitting: Family

## 2022-04-08 ENCOUNTER — Other Ambulatory Visit (HOSPITAL_COMMUNITY): Payer: Self-pay

## 2022-04-08 ENCOUNTER — Telehealth: Payer: Self-pay | Admitting: Family

## 2022-04-08 ENCOUNTER — Encounter: Payer: Self-pay | Admitting: Family

## 2022-04-08 VITALS — BP 116/79 | HR 71 | Temp 98.3°F | Resp 16 | Ht 70.0 in | Wt 138.0 lb

## 2022-04-08 DIAGNOSIS — Z599 Problem related to housing and economic circumstances, unspecified: Secondary | ICD-10-CM

## 2022-04-08 DIAGNOSIS — F419 Anxiety disorder, unspecified: Secondary | ICD-10-CM

## 2022-04-08 DIAGNOSIS — F17210 Nicotine dependence, cigarettes, uncomplicated: Secondary | ICD-10-CM

## 2022-04-08 DIAGNOSIS — R519 Headache, unspecified: Secondary | ICD-10-CM

## 2022-04-08 DIAGNOSIS — F32A Depression, unspecified: Secondary | ICD-10-CM

## 2022-04-08 DIAGNOSIS — F1210 Cannabis abuse, uncomplicated: Secondary | ICD-10-CM

## 2022-04-08 DIAGNOSIS — J45901 Unspecified asthma with (acute) exacerbation: Secondary | ICD-10-CM

## 2022-04-08 DIAGNOSIS — Z720 Tobacco use: Secondary | ICD-10-CM

## 2022-04-08 DIAGNOSIS — Z09 Encounter for follow-up examination after completed treatment for conditions other than malignant neoplasm: Secondary | ICD-10-CM

## 2022-04-08 MED ORDER — HYDROXYZINE HCL 25 MG PO TABS
25.0000 mg | ORAL_TABLET | Freq: Four times a day (QID) | ORAL | 2 refills | Status: AC
  Filled 2022-04-08: qty 30, 8d supply, fill #0

## 2022-04-08 MED ORDER — SUMATRIPTAN SUCCINATE 50 MG PO TABS
ORAL_TABLET | ORAL | 2 refills | Status: AC
  Filled 2022-04-08: qty 30, 15d supply, fill #0

## 2022-04-08 MED ORDER — ALBUTEROL SULFATE HFA 108 (90 BASE) MCG/ACT IN AERS
2.0000 | INHALATION_SPRAY | RESPIRATORY_TRACT | 5 refills | Status: DC | PRN
  Filled 2022-04-08: qty 6.7, 16d supply, fill #0
  Filled 2023-02-17: qty 6.7, 17d supply, fill #0

## 2022-04-08 MED ORDER — FLUOXETINE HCL 20 MG PO CAPS
20.0000 mg | ORAL_CAPSULE | Freq: Every day | ORAL | 2 refills | Status: AC
  Filled 2022-04-08: qty 30, 30d supply, fill #0

## 2022-04-08 MED ORDER — ALBUTEROL SULFATE (2.5 MG/3ML) 0.083% IN NEBU
2.5000 mg | INHALATION_SOLUTION | Freq: Four times a day (QID) | RESPIRATORY_TRACT | 5 refills | Status: DC | PRN
  Filled 2022-04-08: qty 90, 8d supply, fill #0

## 2022-04-08 MED ORDER — KETOROLAC TROMETHAMINE 60 MG/2ML IM SOLN
60.0000 mg | Freq: Once | INTRAMUSCULAR | Status: AC
  Administered 2022-04-08: 60 mg via INTRAMUSCULAR

## 2022-04-08 NOTE — Patient Instructions (Signed)
Asthma, Adult  Asthma is a condition that causes swelling and narrowing of the airways. These are the passages that lead from the nose and mouth down into the lungs. When asthma symptoms get worse it is called an asthma attack or flare. This can make it hard to breathe. Asthma flares can range from minor to life-threatening. There is no cure for asthma, but medicines and lifestyle changes can help to control it. What are the causes? It is not known exactly what causes asthma, but certain things can cause asthma symptoms to get worse (triggers). What can trigger an asthma attack? Cigarette smoke. Mold. Dust. Your pet's skin flakes (dander). Cockroaches. Pollen. Air pollution (like household cleaners, wood smoke, smog, or chemical odors). What are the signs or symptoms? Trouble breathing (shortness of breath). Coughing. Making high-pitched whistling sounds when you breathe, most often when you breathe out (wheezing). Chest tightness. Tiredness with little activity. Poor exercise tolerance. How is this treated? Controller medicines that help prevent asthma symptoms. Fast-acting reliever or rescue medicines. These give short-term relief of asthma symptoms. Allergy medicines if your attacks are brought on by allergens. Medicines to help control the body's defense (immune) system. Staying away from the things that cause asthma attacks. Follow these instructions at home: Avoiding triggers in your home Do not allow anyone to smoke in your home. Limit use of fireplaces and wood stoves. Get rid of pests (such as roaches and mice) and their droppings. Keep your home clean. Clean your floors. Dust regularly. Use cleaning products that do not smell. Wash bed sheets and blankets every week in hot water. Dry them in a dryer. Have someone vacuum when you are not home. Change your heating and air conditioning filters often. Use blankets that are made of polyester or cotton. General  instructions Take over-the-counter and prescription medicines only as told by your doctor. Do not smoke or use any products that contain nicotine or tobacco. If you need help quitting, ask your doctor. Stay away from secondhand smoke. Avoid doing things outdoors when allergen counts are high and when air quality is low. Warm up before you exercise. Take time to cool down after exercise. Use a peak flow meter as told by your doctor. A peak flow meter is a tool that measures how well your lungs are working. Keep track of the peak flow meter's readings. Write them down. Follow your asthma action plan. This is a written plan for taking care of your asthma and treating your attacks. Make sure you get all the shots (vaccines) that your doctor recommends. Ask your doctor about a flu shot and a pneumonia shot. Keep all follow-up visits. Contact a doctor if: You have wheezing, shortness of breath, or a cough even while taking medicine to prevent attacks. The mucus you cough up (sputum) is thicker than usual. The mucus you cough up changes from clear or white to yellow, green, gray, or is bloody. You have problems from the medicine you are taking, such as: A rash. Itching. Swelling. Trouble breathing. You need reliever medicines more than 2-3 times a week. Your peak flow reading is still at 50-79% of your personal best after following the action plan for 1 hour. You have a fever. Get help right away if: You seem to be worse and are not responding to medicine during an asthma attack. You are short of breath even at rest. You get short of breath when doing very little activity. You have trouble eating, drinking, or talking. You have chest   pain or tightness. You have a fast heartbeat. Your lips or fingernails start to turn blue. You are light-headed or dizzy, or you faint. Your peak flow is less than 50% of your personal best. You feel too tired to breathe normally. These symptoms may be an  emergency. Get help right away. Call 911. Do not wait to see if the symptoms will go away. Do not drive yourself to the hospital. Summary Asthma is a long-term (chronic) condition in which the airways get tight and narrow. An asthma attack can make it hard to breathe. Asthma cannot be cured, but medicines and lifestyle changes can help control it. Make sure you understand how to avoid triggers and how and when to use your medicines. Avoid things that can cause allergy symptoms (allergens). These include animal skin flakes (dander) and pollen from trees or grass. Avoid things that pollute the air. These may include household cleaners, wood smoke, smog, or chemical odors. This information is not intended to replace advice given to you by your health care provider. Make sure you discuss any questions you have with your health care provider. Document Revised: 04/01/2021 Document Reviewed: 04/01/2021 Elsevier Patient Education  2023 [employer] Inc.  

## 2022-04-08 NOTE — Progress Notes (Signed)
Pt presents for transition of care -migraine headaches for awhile now -light sensitivity denies any nausea or blurred vision  -needs refill on Hydroxyzine  -wants to discuss starting fluoxetine again was never refilled back in 2022

## 2022-04-09 ENCOUNTER — Inpatient Hospital Stay: Payer: Self-pay | Admitting: Family

## 2022-04-09 ENCOUNTER — Other Ambulatory Visit: Payer: Self-pay | Admitting: Family

## 2022-04-09 DIAGNOSIS — F1210 Cannabis abuse, uncomplicated: Secondary | ICD-10-CM

## 2022-04-09 DIAGNOSIS — D509 Iron deficiency anemia, unspecified: Secondary | ICD-10-CM

## 2022-04-09 DIAGNOSIS — Z720 Tobacco use: Secondary | ICD-10-CM

## 2022-04-09 DIAGNOSIS — J45901 Unspecified asthma with (acute) exacerbation: Secondary | ICD-10-CM

## 2022-04-09 DIAGNOSIS — F32A Anxiety disorder, unspecified: Secondary | ICD-10-CM

## 2022-04-09 DIAGNOSIS — Z09 Encounter for follow-up examination after completed treatment for conditions other than malignant neoplasm: Secondary | ICD-10-CM

## 2022-04-09 LAB — BASIC METABOLIC PANEL
BUN/Creatinine Ratio: 19 (ref 9–20)
BUN: 15 mg/dL (ref 6–20)
CO2: 24 mmol/L (ref 20–29)
Calcium: 9.2 mg/dL (ref 8.7–10.2)
Chloride: 105 mmol/L (ref 96–106)
Creatinine, Ser: 0.81 mg/dL (ref 0.76–1.27)
Glucose: 86 mg/dL (ref 70–99)
Potassium: 4.7 mmol/L (ref 3.5–5.2)
Sodium: 140 mmol/L (ref 134–144)
eGFR: 117 mL/min/{1.73_m2} (ref >59)

## 2022-04-09 LAB — CBC
Hematocrit: 32.3 % — ABNORMAL LOW (ref 37.5–51.0)
Hemoglobin: 10.9 g/dL — ABNORMAL LOW (ref 13.0–17.7)
MCH: 30.4 pg (ref 26.6–33.0)
MCHC: 33.7 g/dL (ref 31.5–35.7)
MCV: 90 fL (ref 79–97)
Platelets: 442 10*3/uL (ref 150–450)
RBC: 3.58 x10E6/uL — ABNORMAL LOW (ref 4.14–5.80)
RDW: 12.9 % (ref 11.6–15.4)
WBC: 7.3 10*3/uL (ref 3.4–10.8)

## 2022-04-09 MED ORDER — IRON (FERROUS SULFATE) 325 (65 FE) MG PO TABS: 325.0000 mg | ORAL_TABLET | Freq: Every day | ORAL | 2 refills | Status: AC

## 2022-04-10 ENCOUNTER — Other Ambulatory Visit (HOSPITAL_COMMUNITY): Payer: Self-pay

## 2022-04-11 ENCOUNTER — Other Ambulatory Visit (HOSPITAL_COMMUNITY): Payer: Self-pay

## 2022-04-22 ENCOUNTER — Ambulatory Visit: Payer: Self-pay | Admitting: Family Medicine

## 2022-04-26 ENCOUNTER — Other Ambulatory Visit (HOSPITAL_COMMUNITY): Payer: Self-pay

## 2022-04-30 NOTE — Telephone Encounter (Signed)
Patient scheduled with Dorna Mai, MD on 05/06/2022.

## 2022-04-30 NOTE — Telephone Encounter (Signed)
I have been trying to get in contact with patient. I left a voicemail today. If he comes in please connect him with me if I am in the office. Thank you

## 2022-05-06 ENCOUNTER — Ambulatory Visit: Payer: Self-pay | Admitting: Family Medicine

## 2023-02-17 ENCOUNTER — Other Ambulatory Visit (HOSPITAL_COMMUNITY): Payer: Self-pay

## 2023-02-17 ENCOUNTER — Ambulatory Visit (INDEPENDENT_AMBULATORY_CARE_PROVIDER_SITE_OTHER): Payer: Self-pay | Admitting: Family

## 2023-02-17 ENCOUNTER — Ambulatory Visit: Payer: Self-pay

## 2023-02-17 ENCOUNTER — Telehealth: Payer: Self-pay | Admitting: Family Medicine

## 2023-02-17 ENCOUNTER — Encounter: Payer: Self-pay | Admitting: Family

## 2023-02-17 VITALS — BP 114/78 | HR 61 | Temp 97.9°F | Ht 73.0 in | Wt 143.4 lb

## 2023-02-17 DIAGNOSIS — J45909 Unspecified asthma, uncomplicated: Secondary | ICD-10-CM

## 2023-02-17 DIAGNOSIS — J45901 Unspecified asthma with (acute) exacerbation: Secondary | ICD-10-CM

## 2023-02-17 MED ORDER — ALBUTEROL SULFATE HFA 108 (90 BASE) MCG/ACT IN AERS: 2.0000 | INHALATION_SPRAY | RESPIRATORY_TRACT | 2 refills | Status: AC | PRN

## 2023-02-17 NOTE — Telephone Encounter (Signed)
Chief Complaint: Asthma Attack Symptoms: wheezing, mild SOB Frequency: comes and goes  Pertinent Negatives: Patient denies chest pain, nausea, vomiting, fever Disposition: [] ED /[] Urgent Care (no appt availability in office) / [x] Appointment(In office/virtual)/ []  [facility]/ [] Home Care/ [] Refused Recommended Disposition /[] [facility]/ []  Follow-up with PCP Additional Notes:  Patient stated that he had an asthma attack this morning and realized he was out of his rescue inhaler. Patient used his nebulizer to treat symptoms. Patient states that he experienced the same thing yesterday and is concerned about have asthma attacks frequently. Care advice given to patient and patient has been scheduled for an OV today at 1600 for evaluation. Also requested for inhaler refill sent.  Reason for Disposition  [1] Mild wheezing comes and goes AND [2] present > 3 days  Answer Assessment - Initial Assessment Questions 1. RESPIRATORY STATUS: "Describe your breathing?" (e.g., wheezing, shortness of breath, unable to speak, severe coughing)      Wheezing, SOB 2. ONSET: "When did this asthma attack begin?"      Around 6am  3. TRIGGER: "What do you think triggered this attack?" (e.g., URI, exposure to pollen or other allergen, tobacco smoke)      Heat exposure, this happens in the summer  4. PEAK EXPIRATORY FLOW RATE (PEFR): "Do you use a peak flow meter?" If Yes, ask: "What's the current peak flow? What's your personal best peak flow?"      No 5. SEVERITY: "How bad is this attack?"    - MILD: No SOB at rest, mild SOB with walking, speaks normally in sentences, can lie down, no retractions, pulse < 100. (GREEN Zone: PEFR 80-100%)   - MODERATE: SOB at rest, SOB with minimal exertion and prefers to sit, cannot lie down flat, speaks in phrases, mild retractions, audible wheezing, pulse 100-120. (YELLOW Zone: PEFR 50-79%)    - SEVERE: Struggling for each breath, speaks in single words,  struggling to breathe, sitting hunched forward, retractions, usually loud wheezing, sometimes minimal wheezing because of decreased air movement, pulse > 120. (RED Zone: PEFR < 50%).      Between mild and moderate  6. ASTHMA MEDICINES:  "What treatments have you tried?"    - INHALED QUICK RELIEF (RESCUE): "What is your inhaled quick-relief medicine?" (e.g., albuterol, salbutamol) "Do you use an inhaler or a nebulizer?" "How frequently have you been using this medicine?"   - CONTROLLER (LONG-TERM-CONTROL): "Do you take an inhaled steroid? (e.g., Asmanex, Flovent, Pulmicort, Qvar)     My rescue haler albuterol and I used my nebulizer.  7. INHALED QUICK-RELIEF TREATMENTS FOR THIS ATTACK: "What treatments have you given yourself so far?" and "How many and how often?" If using an inhaler, ask, "How many puffs?" Note: Routine treatments are 2 puffs every 4 hours as needed. Rescue treatments are 4 puffs repeated every 20 minutes, up to three times as needed.      My nebulizer  8. OTHER SYMPTOMS: "Do you have any other symptoms? (e.g., chest pain, coughing up yellow sputum, fever, runny nose)  Runny nose and coughed up some sputum  Protocols used: Asthma Attack-A-AH

## 2023-02-17 NOTE — Telephone Encounter (Signed)
Medication Refill - Medication:  albuterol (VENTOLIN HFA) 108 (90 Base) MCG/ACT inhaler  Completely out  Has the patient contacted their pharmacy? Yes.  Advised to contact PCP  Preferred Pharmacy (with phone number or street name):  [facility]  Phone: [phone] Fax: [phone]  Has the patient been seen for an appointment in the last year OR does the patient have an upcoming appointment? Yes.  Last seen by PCP in October of 2023

## 2023-02-17 NOTE — Progress Notes (Signed)
Patient ID: Jorge Warmkessel., male    DOB: 14-Sep-1985  MRN: [id number]  CC: Asthma Follow-Up  Subjective: Jorge Williams is a 37 y.o. male who presents for asthma follow-up.  His concerns today include:  Doing well on Albuterol inhaler, no issues/concerns. Denies red flag symptoms associated with asthma.   Patient Active Problem List   Diagnosis Date Noted   Asthma exacerbation 03/23/2022   Tobacco abuse 03/23/2022   Marijuana abuse 03/23/2022   Current severe episode of major depressive disorder without psychotic features without prior episode (HCC)    Anxiety and depression 03/24/2019   Family history of systemic lupus erythematosus 03/24/2019   Family history of multiple sclerosis 03/24/2019   Tremor 01/17/2019   Paresthesia 01/17/2019   SOB (shortness of breath) 07/24/2017     Current Outpatient Medications on File Prior to Visit  Medication Sig Dispense Refill   albuterol (PROVENTIL) (2.5 MG/3ML) 0.083% nebulizer solution Take 3 mLs (2.5 mg total) by nebulization every 6 hours as needed for wheezing or shortness of breath. 90 mL 5   aspirin EC 81 MG tablet Take 325 mg by mouth once. Swallow whole.     fluticasone (FLOVENT HFA) 44 MCG/ACT inhaler Inhale 2 puffs twice daily 10.6 g 0   Iron, Ferrous Sulfate, 325 (65 Fe) MG TABS Take 325 mg by mouth daily. 30 tablet 2   FLUoxetine (PROZAC) 20 MG capsule Take 1 capsule (20 mg total) by mouth daily. 30 capsule 2   hydrOXYzine (ATARAX) 25 MG tablet Take 1 tablet (25 mg total) by mouth every 6 (six) hours. (Patient not taking: Reported on 02/17/2023) 30 tablet 2   Skin Protectants, Misc. (EUCERIN) cream Apply topically as needed for dry skin. (Patient not taking: Reported on 02/17/2023) 454 g 0   SUMAtriptan (IMITREX) 50 MG tablet Take 1 tablet by mouth at the start of the headache. May repeat 1 time in 2 hours if headache persists. (Max of 2 tablets per 24 hours.) (Patient not taking: Reported on 02/17/2023) 30 tablet 2   No  current facility-administered medications on file prior to visit.    Allergies  Allergen Reactions   Apple Juice Itching    Apples make the gums itch   Other Itching    Social History   Socioeconomic History   Marital status: Married    Spouse name: Not on file   Number of children: 1   Years of education: some college   Highest education level: Not on file  Occupational History   Occupation: Loads packages  Tobacco Use   Smoking status: Former    Types: Cigars    Passive exposure: Current   Smokeless tobacco: Never  Vaping Use   Vaping status: Never Used  Substance and Sexual Activity   Alcohol use: Yes    Comment: occ   Drug use: Yes    Frequency: 7.0 times per week    Types: Marijuana    Comment: daily   Sexual activity: Not on file  Other Topics Concern   Not on file  Social History Narrative   Lives at home with his wife and daughter.   Right-handed.   One bottle of Starbucks iced coffee.       Social Determinants of Health   Financial Resource Strain: Not on file  Food Insecurity: No Food Insecurity (03/24/2022)   Hunger Vital Sign    Worried About Running Out of Food in the Last Year: Never true    Ran Out of  Food in the Last Year: Never true  Transportation Needs: No Transportation Needs (03/24/2022)   PRAPARE - Administrator, Civil Service (Medical): No    Lack of Transportation (Non-Medical): No  Physical Activity: Not on file  Stress: Not on file  Social Connections: Not on file  Intimate Partner Violence: Not At Risk (03/24/2022)   Humiliation, Afraid, Rape, and Kick questionnaire    Fear of Current or Ex-Partner: No    Emotionally Abused: No    Physically Abused: No    Sexually Abused: No    Family History  Problem Relation Age of Onset   Hypertension Mother    Healthy Father    Hypertension Father    Healthy Sister    Multiple sclerosis Brother    Stroke Maternal Grandmother    Hypertension Maternal Grandmother     Hypertension Maternal Grandfather    COPD Paternal Grandmother    Asthma Paternal Grandmother     No past surgical history on file.  ROS: Review of Systems Negative except as stated above  PHYSICAL EXAM: BP 114/78   Pulse 61   Temp 97.9 F (36.6 C) (Oral)   Ht 6\' 1"  (1.854 m)   Wt 143 lb 6.4 oz (65 kg)   SpO2 93%   BMI 18.92 kg/m   Physical Exam HENT:     Head: Normocephalic and atraumatic.     Nose: Nose normal.     Mouth/Throat:     Mouth: Mucous membranes are moist.     Pharynx: Oropharynx is clear.  Eyes:     Extraocular Movements: Extraocular movements intact.     Conjunctiva/sclera: Conjunctivae normal.     Pupils: Pupils are equal, round, and reactive to light.  Cardiovascular:     Rate and Rhythm: Normal rate and regular rhythm.     Pulses: Normal pulses.     Heart sounds: Normal heart sounds.  Pulmonary:     Effort: Pulmonary effort is normal.     Breath sounds: Normal breath sounds.  Musculoskeletal:        General: Normal range of motion.     Cervical back: Normal range of motion and neck supple.  Neurological:     General: No focal deficit present.     Mental Status: He is alert and oriented to person, place, and time.  Psychiatric:        Mood and Affect: Mood normal.        Behavior: Behavior normal.     ASSESSMENT AND PLAN: 1. Asthma, unspecified asthma severity, unspecified whether complicated, unspecified whether persistent - Continue Albuterol inhaler as prescribed. Counseled on medication adherence.  - Follow-up with primary provider as scheduled.  - albuterol (VENTOLIN HFA) 108 (90 Base) MCG/ACT inhaler; Inhale 2 puffs into the lungs every 4 (four) hours as needed for wheezing or shortness of breath (cough, shortness of breath or wheezing.).  Dispense: 18 g; Refill: 2   Patient was given the opportunity to ask questions.  Patient verbalized understanding of the plan and was able to repeat key elements of the plan. Patient was given clear  instructions to go to Emergency Department or return to medical center if symptoms don't improve, worsen, or new problems develop.The patient verbalized understanding.   Requested Prescriptions   Signed Prescriptions Disp Refills   albuterol (VENTOLIN HFA) 108 (90 Base) MCG/ACT inhaler 18 g 2    Sig: Inhale 2 puffs into the lungs every 4 (four) hours as needed for wheezing or shortness of  breath (cough, shortness of breath or wheezing.).    Return in about 4 weeks (around 03/17/2023) for Follow-Up or next available Georganna Skeans, MD .  Rema Fendt, NP

## 2023-02-17 NOTE — Telephone Encounter (Signed)
[facility] called and spoke to Uehling about the refill(s) albuterol inhaler requested. Advised it was sent on 04/08/22 #6.7 g/5 refill(s) and it's never dispensed. She says it's on file and due to no insurance on file it will be $32. She says if he wants to have them to refill to call and let them know. I asked about Good Rx or any program to make it cheaper. She says they don't have that, but he can call [facility] or [facility] to ask them to call [facility] to transfer the Rx over the phone. Patient called and advised of the above. He says he may stop at [facility] on his way to his appointment today to ask about that.

## 2023-03-20 ENCOUNTER — Encounter: Payer: Self-pay | Admitting: Family Medicine

## 2023-03-20 ENCOUNTER — Ambulatory Visit (INDEPENDENT_AMBULATORY_CARE_PROVIDER_SITE_OTHER): Payer: Self-pay | Admitting: Family Medicine

## 2023-03-20 VITALS — BP 132/88 | HR 56 | Temp 98.1°F | Resp 16 | Ht 73.0 in | Wt 145.0 lb

## 2023-03-20 DIAGNOSIS — J4520 Mild intermittent asthma, uncomplicated: Secondary | ICD-10-CM

## 2023-03-20 DIAGNOSIS — Z720 Tobacco use: Secondary | ICD-10-CM

## 2023-03-23 ENCOUNTER — Encounter: Payer: Self-pay | Admitting: Family Medicine

## 2023-03-23 NOTE — Progress Notes (Signed)
Established Patient Office Visit  Subjective    Patient ID: Jorge Williams., male    DOB: 1986/06/13  Age: 37 y.o. MRN: [id number]  CC:  Chief Complaint  Patient presents with   Follow-up    Asthma,      HPI [name] presents for follow up of asthma. Patient denies acute complaints.   Outpatient Encounter Medications as of 03/20/2023  Medication Sig   albuterol (PROVENTIL) (2.5 MG/3ML) 0.083% nebulizer solution Take 3 mLs (2.5 mg total) by nebulization every 6 hours as needed for wheezing or shortness of breath.   albuterol (VENTOLIN HFA) 108 (90 Base) MCG/ACT inhaler Inhale 2 puffs into the lungs every 4 (four) hours as needed for wheezing or shortness of breath (cough, shortness of breath or wheezing.).   aspirin EC 81 MG tablet Take 325 mg by mouth once. Swallow whole.   FLUoxetine (PROZAC) 20 MG capsule Take 1 capsule (20 mg total) by mouth daily.   fluticasone (FLOVENT HFA) 44 MCG/ACT inhaler Inhale 2 puffs twice daily   hydrOXYzine (ATARAX) 25 MG tablet Take 1 tablet (25 mg total) by mouth every 6 (six) hours. (Patient not taking: Reported on 02/17/2023)   Iron, Ferrous Sulfate, 325 (65 Fe) MG TABS Take 325 mg by mouth daily.   Skin Protectants, Misc. (EUCERIN) cream Apply topically as needed for dry skin. (Patient not taking: Reported on 02/17/2023)   SUMAtriptan (IMITREX) 50 MG tablet Take 1 tablet by mouth at the start of the headache. May repeat 1 time in 2 hours if headache persists. (Max of 2 tablets per 24 hours.) (Patient not taking: Reported on 02/17/2023)   No facility-administered encounter medications on file as of 03/20/2023.    Past Medical History:  Diagnosis Date   Asthma 1994   Bronchitis 1997   Pneumonia 2013    History reviewed. No pertinent surgical history.  Family History  Problem Relation Age of Onset   Hypertension Mother    Healthy Father    Hypertension Father    Healthy Sister    Multiple sclerosis Brother    Stroke  Maternal Grandmother    Hypertension Maternal Grandmother    Hypertension Maternal Grandfather    COPD Paternal Grandmother    Asthma Paternal Grandmother     Social History   Socioeconomic History   Marital status: Married    Spouse name: Not on file   Number of children: 1   Years of education: some college   Highest education level: Not on file  Occupational History   Occupation: Loads packages  Tobacco Use   Smoking status: Former    Types: Gaffer exposure: Current   Smokeless tobacco: Never  Vaping Use   Vaping status: Never Used  Substance and Sexual Activity   Alcohol use: Yes    Comment: occ   Drug use: Yes    Frequency: 7.0 times per week    Types: Marijuana    Comment: daily   Sexual activity: Not on file  Other Topics Concern   Not on file  Social History Narrative   Lives at home with his wife and daughter.   Right-handed.   One bottle of Starbucks iced coffee.       Social Determinants of Health   Financial Resource Strain: Low Risk  (03/20/2023)   Overall Financial Resource Strain (CARDIA)    Difficulty of Paying Living Expenses: Not hard at all  Food Insecurity: No Food Insecurity (03/20/2023)  Hunger Vital Sign    Worried About Running Out of Food in the Last Year: Never true    Ran Out of Food in the Last Year: Never true  Transportation Needs: No Transportation Needs (03/20/2023)   PRAPARE - Administrator, Civil Service (Medical): No    Lack of Transportation (Non-Medical): No  Physical Activity: Insufficiently Active (03/20/2023)   Exercise Vital Sign    Days of Exercise per Week: 4 days    Minutes of Exercise per Session: 30 min  Stress: Stress Concern Present (03/20/2023)   [employer] of Occupational Health - Occupational Stress Questionnaire    Feeling of Stress : To some extent  Social Connections: Moderately Isolated (03/20/2023)   Social Connection and Isolation Panel [NHANES]    Frequency of Communication  with Friends and Family: More than three times a week    Frequency of Social Gatherings with Friends and Family: More than three times a week    Attends Religious Services: 1 to 4 times per year    Active Member of [employer] or Organizations: No    Attends Banker Meetings: Never    Marital Status: Separated  Intimate Partner Violence: Not At Risk (03/20/2023)   Humiliation, Afraid, Rape, and Kick questionnaire    Fear of Current or Ex-Partner: No    Emotionally Abused: No    Physically Abused: No    Sexually Abused: No    Review of Systems  All other systems reviewed and are negative.       Objective    BP 132/88 (BP Location: Right Arm, Patient Position: Sitting, Cuff Size: Normal)   Pulse (!) 56   Temp 98.1 F (36.7 C) (Oral)   Resp 16   Ht 6\' 1"  (1.854 m)   Wt 145 lb (65.8 kg)   SpO2 95%   BMI 19.13 kg/m   Physical Exam Vitals and nursing note reviewed.  Constitutional:      General: He is not in acute distress. Cardiovascular:     Rate and Rhythm: Normal rate and regular rhythm.  Pulmonary:     Effort: Pulmonary effort is normal.     Breath sounds: Normal breath sounds.  Neurological:     General: No focal deficit present.     Mental Status: He is alert and oriented to person, place, and time.         Assessment & Plan:   Mild intermittent asthma without complication  Tobacco abuse     Return if symptoms worsen or fail to improve.   [name], MD

## 2023-04-22 ENCOUNTER — Ambulatory Visit: Payer: Self-pay

## 2023-04-22 NOTE — Telephone Encounter (Signed)
Chief Complaint: Asthma Attack Symptoms: asthma attack, runny nose Frequency: one episode Pertinent Negatives: Patient denies chest pain, SOB, wheezing Disposition: [] ED /[] Urgent Care (no appt availability in office) / [x] Appointment(In office/virtual)/ []  [facility]/ [] Home Care/ [] Refused Recommended Disposition /[] [facility]/ []  Follow-up with PCP Additional Notes: Patient states he started wheezing around 0200-0300 but could not find his rescue inhaler. Patient stated the wheezing turned into an asthma attack around 0745. EMS was called and patient was giving a nebulizer treatment by EMS. Patient states EMS found his rescue inhaler outside near his car. Patient states his symptoms are much better now, better much back at baseline. Patient stated he was calling because EMS informed him that he may need a new inhaler since it was outside for so long. Care advice was given and patient was informed that he has 2 refills available at the pharmacy he will need to call the pharmacy to get the refill. Patient verbalized understanding. Patient also was offered an appointment with PCP this week to follow-up related to his asthma. Patient stated he just signed up for insurance but does not know when it will be effective since he has not received the card yet. Patient requested a later date for his appointment. Patient has been scheduled 05/12/23. Patient also stated that the sample inhaler that PCP let him try at last visit was very helpful.   Reason for Disposition  [1] MILD asthma attack (e.g., no SOB at rest, mild SOB with walking, speaks normally in sentences, mild wheezing) AND [2] lasting > 24 hours on prescribed treatment  Answer Assessment - Initial Assessment Questions 1. RESPIRATORY STATUS: "Describe your breathing?" (e.g., wheezing, shortness of breath, unable to speak, severe coughing)      Mild wheezing 2. ONSET: "When did this asthma attack begin?"      Around 7;45am 3.  TRIGGER: "What do you think triggered this attack?" (e.g., URI, exposure to pollen or other allergen, tobacco smoke)      Maybe air quality or the season change  4. PEAK EXPIRATORY FLOW RATE (PEFR): "Do you use a peak flow meter?" If Yes, ask: "What's the current peak flow? What's your personal best peak flow?"      No  5. SEVERITY: "How bad is this attack?"    - MILD: No SOB at rest, mild SOB with walking, speaks normally in sentences, can lie down, no retractions, pulse < 100. (GREEN Zone: PEFR 80-100%)   - MODERATE: SOB at rest, SOB with minimal exertion and prefers to sit, cannot lie down flat, speaks in phrases, mild retractions, audible wheezing, pulse 100-120. (YELLOW Zone: PEFR 50-79%)    - SEVERE: Struggling for each breath, speaks in single words, struggling to breathe, sitting hunched forward, retractions, usually loud wheezing, sometimes minimal wheezing because of decreased air movement, pulse > 120. (RED Zone: PEFR < 50%).      Severe 6. ASTHMA MEDICINES:  "What treatments have you tried?"    - INHALED QUICK RELIEF (RESCUE): "What is your inhaled quick-relief medicine?" (e.g., albuterol, salbutamol) "Do you use an inhaler or a nebulizer?" "How frequently have you been using this medicine?"   - CONTROLLER (LONG-TERM-CONTROL): "Do you take an inhaled steroid? (e.g., Asmanex, Flovent, Pulmicort, Qvar)     EMS gave me a breathing treatment 7. INHALED QUICK-RELIEF TREATMENTS FOR THIS ATTACK: "What treatments have you given yourself so far?" and "How many and how often?" If using an inhaler, ask, "How many puffs?" Note: Routine treatments are 2  puffs every 4 hours as needed. Rescue treatments are 4 puffs repeated every 20 minutes, up to three times as needed.      EMS gave me a breathing treatment 8. OTHER SYMPTOMS: "Do you have any other symptoms? (e.g., chest pain, coughing up yellow sputum, fever, runny nose)     Runny nose  Protocols used: Asthma Attack-A-AH

## 2023-04-23 NOTE — Telephone Encounter (Signed)
Patient has taking care of everything

## 2023-05-12 ENCOUNTER — Ambulatory Visit (INDEPENDENT_AMBULATORY_CARE_PROVIDER_SITE_OTHER): Payer: 59 | Admitting: Family Medicine

## 2023-05-12 ENCOUNTER — Encounter: Payer: Self-pay | Admitting: Family Medicine

## 2023-05-12 VITALS — BP 128/81 | HR 58 | Temp 97.9°F | Resp 16 | Ht 73.0 in | Wt 147.0 lb

## 2023-05-12 DIAGNOSIS — J4520 Mild intermittent asthma, uncomplicated: Secondary | ICD-10-CM

## 2023-05-12 DIAGNOSIS — F1210 Cannabis abuse, uncomplicated: Secondary | ICD-10-CM

## 2023-05-12 MED ORDER — FLUTICASONE PROPIONATE HFA 110 MCG/ACT IN AERO: 1.0000 | INHALATION_SPRAY | Freq: Two times a day (BID) | RESPIRATORY_TRACT | 5 refills | Status: AC

## 2023-05-12 NOTE — Progress Notes (Signed)
Established Patient Office Visit  Subjective    Patient ID: Jorge Williams., male    DOB: [birth year]  Age: 37 y.o. MRN: [id number]  CC:  Chief Complaint  Patient presents with   Follow-up    Asthma, question about inhaler    HPI Damari E Criss Alvine. presents for follow up of asthma. Patient reports some improvement with present management but still uses albuterol at least 5+ times per week.   Outpatient Encounter Medications as of 05/12/2023  Medication Sig   fluticasone (FLOVENT HFA) 110 MCG/ACT inhaler Inhale 1 puff into the lungs 2 (two) times daily.   albuterol (PROVENTIL) (2.5 MG/3ML) 0.083% nebulizer solution Take 3 mLs (2.5 mg total) by nebulization every 6 hours as needed for wheezing or shortness of breath.   albuterol (VENTOLIN HFA) 108 (90 Base) MCG/ACT inhaler Inhale 2 puffs into the lungs every 4 (four) hours as needed for wheezing or shortness of breath (cough, shortness of breath or wheezing.).   aspirin EC 81 MG tablet Take 325 mg by mouth once. Swallow whole. (Patient not taking: Reported on 05/12/2023)   FLUoxetine (PROZAC) 20 MG capsule Take 1 capsule (20 mg total) by mouth daily.   fluticasone (FLOVENT HFA) 44 MCG/ACT inhaler Inhale 2 puffs twice daily   hydrOXYzine (ATARAX) 25 MG tablet Take 1 tablet (25 mg total) by mouth every 6 (six) hours. (Patient not taking: Reported on 02/17/2023)   Iron, Ferrous Sulfate, 325 (65 Fe) MG TABS Take 325 mg by mouth daily.   Skin Protectants, Misc. (EUCERIN) cream Apply topically as needed for dry skin. (Patient not taking: Reported on 02/17/2023)   SUMAtriptan (IMITREX) 50 MG tablet Take 1 tablet by mouth at the start of the headache. May repeat 1 time in 2 hours if headache persists. (Max of 2 tablets per 24 hours.) (Patient not taking: Reported on 02/17/2023)   No facility-administered encounter medications on file as of 05/12/2023.    Past Medical History:  Diagnosis Date   Asthma 1994   Bronchitis 1997   Pneumonia  2013    History reviewed. No pertinent surgical history.  Family History  Problem Relation Age of Onset   Hypertension Mother    Healthy Father    Hypertension Father    Healthy Sister    Multiple sclerosis Brother    Stroke Maternal Grandmother    Hypertension Maternal Grandmother    Hypertension Maternal Grandfather    COPD Paternal Grandmother    Asthma Paternal Grandmother     Social History   Socioeconomic History   Marital status: Married    Spouse name: Not on file   Number of children: 1   Years of education: some college   Highest education level: Not on file  Occupational History   Occupation: Loads packages  Tobacco Use   Smoking status: Former    Types: Gaffer exposure: Current   Smokeless tobacco: Never  Vaping Use   Vaping status: Never Used  Substance and Sexual Activity   Alcohol use: Yes    Comment: occ   Drug use: Yes    Frequency: 7.0 times per week    Types: Marijuana    Comment: daily   Sexual activity: Not on file  Other Topics Concern   Not on file  Social History Narrative   Lives at home with his wife and daughter.   Right-handed.   One bottle of Starbucks iced coffee.       Social Determinants of  Health   Financial Resource Strain: Low Risk  (03/20/2023)   Overall Financial Resource Strain (CARDIA)    Difficulty of Paying Living Expenses: Not hard at all  Food Insecurity: No Food Insecurity (03/20/2023)   Hunger Vital Sign    Worried About Running Out of Food in the Last Year: Never true    Ran Out of Food in the Last Year: Never true  Transportation Needs: No Transportation Needs (03/20/2023)   PRAPARE - Administrator, Civil Service (Medical): No    Lack of Transportation (Non-Medical): No  Physical Activity: Sufficiently Active (05/12/2023)   Exercise Vital Sign    Days of Exercise per Week: 5 days    Minutes of Exercise per Session: 30 min  Recent Concern: Physical Activity - Insufficiently Active  (03/20/2023)   Exercise Vital Sign    Days of Exercise per Week: 4 days    Minutes of Exercise per Session: 30 min  Stress: No Stress Concern Present (05/12/2023)   [employer] of Occupational Health - Occupational Stress Questionnaire    Feeling of Stress : Only a little  Recent Concern: Stress - Stress Concern Present (03/20/2023)   [employer] of Occupational Health - Occupational Stress Questionnaire    Feeling of Stress : To some extent  Social Connections: Moderately Isolated (03/20/2023)   Social Connection and Isolation Panel [NHANES]    Frequency of Communication with Friends and Family: More than three times a week    Frequency of Social Gatherings with Friends and Family: More than three times a week    Attends Religious Services: 1 to 4 times per year    Active Member of [employer] or Organizations: No    Attends Banker Meetings: Never    Marital Status: Separated  Intimate Partner Violence: Not At Risk (03/20/2023)   Humiliation, Afraid, Rape, and Kick questionnaire    Fear of Current or Ex-Partner: No    Emotionally Abused: No    Physically Abused: No    Sexually Abused: No    Review of Systems  All other systems reviewed and are negative.       Objective    BP 128/81 (BP Location: Right Arm, Patient Position: Sitting, Cuff Size: Normal)   Pulse (!) 58   Temp 97.9 F (36.6 C) (Oral)   Resp 16   Ht 6\' 1"  (1.854 m)   Wt 147 lb (66.7 kg)   SpO2 94%   BMI 19.39 kg/m   Physical Exam Vitals and nursing note reviewed.  Constitutional:      General: He is not in acute distress. Cardiovascular:     Rate and Rhythm: Normal rate and regular rhythm.  Pulmonary:     Effort: Pulmonary effort is normal.     Breath sounds: Normal breath sounds.  Abdominal:     Palpations: Abdomen is soft.     Tenderness: There is no abdominal tenderness.  Neurological:     General: No focal deficit present.     Mental Status: He is alert and oriented to  person, place, and time.         Assessment & Plan:  1. Mild intermittent asthma without complication Flovent MDI prescribed. Utilize albuterol prn. continue  2. Marijuana abuse     No follow-ups on file.   [name], MD

## 2023-05-22 ENCOUNTER — Other Ambulatory Visit: Payer: Self-pay

## 2023-06-02 ENCOUNTER — Other Ambulatory Visit: Payer: Self-pay | Admitting: Family

## 2023-06-02 DIAGNOSIS — J45901 Unspecified asthma with (acute) exacerbation: Secondary | ICD-10-CM

## 2023-06-03 ENCOUNTER — Other Ambulatory Visit: Payer: Self-pay

## 2023-06-09 ENCOUNTER — Other Ambulatory Visit (HOSPITAL_COMMUNITY): Payer: Self-pay

## 2023-06-09 MED ORDER — ALBUTEROL SULFATE (2.5 MG/3ML) 0.083% IN NEBU
2.5000 mg | INHALATION_SOLUTION | Freq: Four times a day (QID) | RESPIRATORY_TRACT | 5 refills | Status: AC | PRN
  Filled 2023-06-09: qty 90, 8d supply, fill #0

## 2023-06-15 ENCOUNTER — Other Ambulatory Visit (HOSPITAL_COMMUNITY): Payer: Self-pay

## 2023-08-13 ENCOUNTER — Ambulatory Visit (INDEPENDENT_AMBULATORY_CARE_PROVIDER_SITE_OTHER): Payer: 59 | Admitting: Family Medicine

## 2023-08-13 ENCOUNTER — Encounter: Payer: Self-pay | Admitting: Family Medicine

## 2023-08-13 VITALS — BP 123/76 | HR 58 | Temp 98.1°F | Resp 16 | Ht 73.0 in | Wt 153.0 lb

## 2023-08-13 DIAGNOSIS — J4520 Mild intermittent asthma, uncomplicated: Secondary | ICD-10-CM

## 2023-08-13 DIAGNOSIS — Z720 Tobacco use: Secondary | ICD-10-CM

## 2023-08-13 MED ORDER — AIRSUPRA 90-80 MCG/ACT IN AERO: 90.0000 ug | INHALATION_SPRAY | Freq: Four times a day (QID) | RESPIRATORY_TRACT | 2 refills | Status: AC | PRN

## 2023-08-17 ENCOUNTER — Encounter: Payer: Self-pay | Admitting: Family Medicine

## 2023-08-17 NOTE — Progress Notes (Signed)
 Established Patient Office Visit  Subjective    Patient ID: Jorge Abdallah., male    DOB: Dec 02, 1985  Age: 38 y.o. MRN: [id number]  CC:  Chief Complaint  Patient presents with   Follow-up    3 mont, inhaler not working    HPI Jorge E Atlas Jr. presents for follow up of asthma. Patient reports that he does well with the Airsupra  and would like a prescription if possible.    Outpatient Encounter Medications as of 08/13/2023  Medication Sig   albuterol  (PROVENTIL ) (2.5 MG/3ML) 0.083% nebulizer solution Inhale 3 mLs (2.5 mg total) by nebulization every 6 hours as needed for wheezing or shortness of breath.   albuterol  (VENTOLIN  HFA) 108 (90 Base) MCG/ACT inhaler Inhale 2 puffs into the lungs every 4 (four) hours as needed for wheezing or shortness of breath (cough, shortness of breath or wheezing.).   Albuterol -Budesonide  (AIRSUPRA ) 90-80 MCG/ACT AERO Inhale 90 mcg into the lungs 4 (four) times daily as needed (for asthma sx).   fluticasone  (FLOVENT  HFA) 110 MCG/ACT inhaler Inhale 1 puff into the lungs 2 (two) times daily.   Iron , Ferrous Sulfate , 325 (65 Fe) MG TABS Take 325 mg by mouth daily.   aspirin EC 81 MG tablet Take 325 mg by mouth once. Swallow whole. (Patient not taking: Reported on 05/12/2023)   FLUoxetine  (PROZAC ) 20 MG capsule Take 1 capsule (20 mg total) by mouth daily.   fluticasone  (FLOVENT  HFA) 44 MCG/ACT inhaler Inhale 2 puffs twice daily (Patient not taking: Reported on 08/13/2023)   hydrOXYzine  (ATARAX ) 25 MG tablet Take 1 tablet (25 mg total) by mouth every 6 (six) hours. (Patient not taking: Reported on 02/17/2023)   Skin Protectants, Misc. (EUCERIN) cream Apply topically as needed for dry skin. (Patient not taking: Reported on 02/17/2023)   SUMAtriptan  (IMITREX ) 50 MG tablet Take 1 tablet by mouth at the start of the headache. May repeat 1 time in 2 hours if headache persists. (Max of 2 tablets per 24 hours.) (Patient not taking: Reported on 02/17/2023)   No  facility-administered encounter medications on file as of 08/13/2023.    Past Medical History:  Diagnosis Date   Asthma 1994   Bronchitis 1997   Pneumonia 2013    History reviewed. No pertinent surgical history.  Family History  Problem Relation Age of Onset   Hypertension Mother    Healthy Father    Hypertension Father    Healthy Sister    Multiple sclerosis Brother    Stroke Maternal Grandmother    Hypertension Maternal Grandmother    Hypertension Maternal Grandfather    COPD Paternal Grandmother    Asthma Paternal Grandmother     Social History   Socioeconomic History   Marital status: Married    Spouse name: Not on file   Number of children: 1   Years of education: some college   Highest education level: Not on file  Occupational History   Occupation: Loads packages  Tobacco Use   Smoking status: Former    Types: Gaffer exposure: Current   Smokeless tobacco: Never  Vaping Use   Vaping status: Never Used  Substance and Sexual Activity   Alcohol use: Yes    Comment: occ   Drug use: Yes    Frequency: 7.0 times per week    Types: Marijuana    Comment: daily   Sexual activity: Not on file  Other Topics Concern   Not on file  Social History Narrative  Lives at home with his wife and daughter.   Right-handed.   One bottle of [employer] iced coffee.       Social Drivers of Corporate Investment Banker Strain: Low Risk  (03/20/2023)   Overall Financial Resource Strain (CARDIA)    Difficulty of Paying Living Expenses: Not hard at all  Food Insecurity: No Food Insecurity (03/20/2023)   Hunger Vital Sign    Worried About Running Out of Food in the Last Year: Never true    Ran Out of Food in the Last Year: Never true  Transportation Needs: No Transportation Needs (03/20/2023)   PRAPARE - Administrator, Civil Service (Medical): No    Lack of Transportation (Non-Medical): No  Physical Activity: Sufficiently Active (05/12/2023)   Exercise  Vital Sign    Days of Exercise per Week: 5 days    Minutes of Exercise per Session: 30 min  Recent Concern: Physical Activity - Insufficiently Active (03/20/2023)   Exercise Vital Sign    Days of Exercise per Week: 4 days    Minutes of Exercise per Session: 30 min  Stress: No Stress Concern Present (05/12/2023)   [employer] of Occupational Health - Occupational Stress Questionnaire    Feeling of Stress : Only a little  Recent Concern: Stress - Stress Concern Present (03/20/2023)   [employer] of Occupational Health - Occupational Stress Questionnaire    Feeling of Stress : To some extent  Social Connections: Moderately Isolated (03/20/2023)   Social Connection and Isolation Panel [NHANES]    Frequency of Communication with Friends and Family: More than three times a week    Frequency of Social Gatherings with Friends and Family: More than three times a week    Attends Religious Services: 1 to 4 times per year    Active Member of [employer] or Organizations: No    Attends Banker Meetings: Never    Marital Status: Separated  Intimate Partner Violence: Not At Risk (03/20/2023)   Humiliation, Afraid, Rape, and Kick questionnaire    Fear of Current or Ex-Partner: No    Emotionally Abused: No    Physically Abused: No    Sexually Abused: No    Review of Systems  All other systems reviewed and are negative.       Objective    BP 123/76 (BP Location: Right Arm, Patient Position: Sitting, Cuff Size: Normal)   Pulse (!) 58   Temp 98.1 F (36.7 C) (Oral)   Resp 16   Ht 6' 1 (1.854 m)   Wt 153 lb (69.4 kg)   SpO2 95%   BMI 20.19 kg/m   Physical Exam Vitals and nursing note reviewed.  Constitutional:      General: He is not in acute distress. Cardiovascular:     Rate and Rhythm: Normal rate and regular rhythm.  Pulmonary:     Effort: Pulmonary effort is normal.     Breath sounds: Normal breath sounds.  Abdominal:     Palpations: Abdomen is soft.      Tenderness: There is no abdominal tenderness.  Neurological:     General: No focal deficit present.     Mental Status: He is alert and oriented to person, place, and time.         Assessment & Plan:   Mild intermittent asthma without complication  Tobacco abuse  Other orders -     Airsupra ; Inhale 90 mcg into the lungs 4 (four) times daily as needed (for asthma  sx).  Dispense: 10.7 g; Refill: 2     Return in about 6 weeks (around 09/24/2023).   Tanda Raguel SQUIBB, MD

## 2023-09-24 ENCOUNTER — Encounter: Payer: Self-pay | Admitting: Family Medicine

## 2023-09-24 ENCOUNTER — Ambulatory Visit (INDEPENDENT_AMBULATORY_CARE_PROVIDER_SITE_OTHER): Payer: 59 | Admitting: Family Medicine

## 2023-09-24 VITALS — BP 126/83 | HR 60 | Temp 98.5°F | Resp 16 | Ht 73.0 in | Wt 155.8 lb

## 2023-09-24 DIAGNOSIS — J4520 Mild intermittent asthma, uncomplicated: Secondary | ICD-10-CM

## 2023-09-24 NOTE — Progress Notes (Signed)
 Established Patient Office Visit  Subjective    Patient ID: Jorge Williams., male    DOB: [birth year]  Age: 38 y.o. MRN: [id number]  CC:  Chief Complaint  Patient presents with   Follow-up    6 week    HPI Jorge Williams. presents for follow up of asthma. Patient reports that he is doing well with his present regimen.   Outpatient Encounter Medications as of 09/24/2023  Medication Sig   albuterol (PROVENTIL) (2.5 MG/3ML) 0.083% nebulizer solution Inhale 3 mLs (2.5 mg total) by nebulization every 6 hours as needed for wheezing or shortness of breath.   albuterol (VENTOLIN HFA) 108 (90 Base) MCG/ACT inhaler Inhale 2 puffs into the lungs every 4 (four) hours as needed for wheezing or shortness of breath (cough, shortness of breath or wheezing.).   Albuterol-Budesonide (AIRSUPRA) 90-80 MCG/ACT AERO Inhale 90 mcg into the lungs 4 (four) times daily as needed (for asthma sx).   fluticasone (FLOVENT HFA) 110 MCG/ACT inhaler Inhale 1 puff into the lungs 2 (two) times daily.   Iron, Ferrous Sulfate, 325 (65 Fe) MG TABS Take 325 mg by mouth daily.   aspirin EC 81 MG tablet Take 325 mg by mouth once. Swallow whole. (Patient not taking: Reported on 05/12/2023)   FLUoxetine (PROZAC) 20 MG capsule Take 1 capsule (20 mg total) by mouth daily.   fluticasone (FLOVENT HFA) 44 MCG/ACT inhaler Inhale 2 puffs twice daily (Patient not taking: Reported on 08/13/2023)   hydrOXYzine (ATARAX) 25 MG tablet Take 1 tablet (25 mg total) by mouth every 6 (six) hours. (Patient not taking: Reported on 02/17/2023)   Skin Protectants, Misc. (EUCERIN) cream Apply topically as needed for dry skin. (Patient not taking: Reported on 02/17/2023)   SUMAtriptan (IMITREX) 50 MG tablet Take 1 tablet by mouth at the start of the headache. May repeat 1 time in 2 hours if headache persists. (Max of 2 tablets per 24 hours.) (Patient not taking: Reported on 02/17/2023)   No facility-administered encounter medications on file as of  09/24/2023.    Past Medical History:  Diagnosis Date   Asthma 1994   Bronchitis 1997   Pneumonia 2013    History reviewed. No pertinent surgical history.  Family History  Problem Relation Age of Onset   Hypertension Mother    Healthy Father    Hypertension Father    Healthy Sister    Multiple sclerosis Brother    Stroke Maternal Grandmother    Hypertension Maternal Grandmother    Hypertension Maternal Grandfather    COPD Paternal Grandmother    Asthma Paternal Grandmother     Social History   Socioeconomic History   Marital status: Married    Spouse name: Not on file   Number of children: 1   Years of education: some college   Highest education level: Not on file  Occupational History   Occupation: Loads packages  Tobacco Use   Smoking status: Former    Types: Gaffer exposure: Current   Smokeless tobacco: Never  Vaping Use   Vaping status: Never Used  Substance and Sexual Activity   Alcohol use: Yes    Comment: occ   Drug use: Yes    Frequency: 7.0 times per week    Types: Marijuana    Comment: daily   Sexual activity: Not on file  Other Topics Concern   Not on file  Social History Narrative   Lives at home with his wife and daughter.  Right-handed.   One bottle of Starbucks iced coffee.       Social Drivers of Corporate investment banker Strain: Low Risk  (03/20/2023)   Overall Financial Resource Strain (CARDIA)    Difficulty of Paying Living Expenses: Not hard at all  Food Insecurity: No Food Insecurity (03/20/2023)   Hunger Vital Sign    Worried About Running Out of Food in the Last Year: Never true    Ran Out of Food in the Last Year: Never true  Transportation Needs: No Transportation Needs (03/20/2023)   PRAPARE - Administrator, Civil Service (Medical): No    Lack of Transportation (Non-Medical): No  Physical Activity: Sufficiently Active (05/12/2023)   Exercise Vital Sign    Days of Exercise per Week: 5 days     Minutes of Exercise per Session: 30 min  Recent Concern: Physical Activity - Insufficiently Active (03/20/2023)   Exercise Vital Sign    Days of Exercise per Week: 4 days    Minutes of Exercise per Session: 30 min  Stress: No Stress Concern Present (05/12/2023)   [employer] of Occupational Health - Occupational Stress Questionnaire    Feeling of Stress : Only a little  Recent Concern: Stress - Stress Concern Present (03/20/2023)   [employer] of Occupational Health - Occupational Stress Questionnaire    Feeling of Stress : To some extent  Social Connections: Moderately Isolated (03/20/2023)   Social Connection and Isolation Panel [NHANES]    Frequency of Communication with Friends and Family: More than three times a week    Frequency of Social Gatherings with Friends and Family: More than three times a week    Attends Religious Services: 1 to 4 times per year    Active Member of [employer] or Organizations: No    Attends Banker Meetings: Never    Marital Status: Separated  Intimate Partner Violence: Not At Risk (03/20/2023)   Humiliation, Afraid, Rape, and Kick questionnaire    Fear of Current or Ex-Partner: No    Emotionally Abused: No    Physically Abused: No    Sexually Abused: No    Review of Systems  All other systems reviewed and are negative.       Objective    BP 126/83   Pulse 60   Temp 98.5 F (36.9 C) (Oral)   Resp 16   Ht 6\' 1"  (1.854 m)   Wt 155 lb 12.8 oz (70.7 kg)   SpO2 95%   BMI 20.56 kg/m   Physical Exam Vitals and nursing note reviewed.  Constitutional:      General: He is not in acute distress. Cardiovascular:     Rate and Rhythm: Normal rate and regular rhythm.  Pulmonary:     Effort: Pulmonary effort is normal.     Breath sounds: Normal breath sounds.  Abdominal:     Palpations: Abdomen is soft.     Tenderness: There is no abdominal tenderness.  Neurological:     General: No focal deficit present.     Mental Status:  He is alert and oriented to person, place, and time.         Assessment & Plan:   1. Mild intermittent asthma without complication (Primary) Improved. Continue present management   Return if symptoms worsen or fail to improve.   [name], MD

## 2024-04-06 ENCOUNTER — Ambulatory Visit: Payer: Self-pay

## 2024-04-06 NOTE — Telephone Encounter (Signed)
 FYI Only or Action Required?: FYI only for provider.  Patient was last seen in primary care on 09/24/2023 by Tanda Bleacher, MD.  Called Nurse Triage reporting Head Injury.  Symptoms began several days ago.  Interventions attempted: Nothing.  Symptoms are: gradually worsening.  Triage Disposition: Go to ED Now (Notify PCP)  Patient/caregiver understands and will follow disposition?: Yes    Copied from CRM [id number]. Topic: Clinical - Red Word Triage >> Apr 06, 2024  8:30 AM Jorge Williams wrote: Red Word that prompted transfer to Nurse Triage: hit his head two days later his left side was swollen and couldn't move. Some pain. Reason for Disposition  Vomiting once or more  Answer Assessment - Initial Assessment Questions 1. MECHANISM: How did the injury happen? For falls, ask: What height did you fall from? and What surface did you fall against?      Getting in a truck and jumped in too quick and hit head on door frame 2. ONSET: When did the injury happen? (e.g., minutes, hours ago)      Last Friday, noticed pain on Monday. 3. NEUROLOGIC SYMPTOMS: Was there any loss of consciousness? Are there any other neurological symptoms?      denies 4. MENTAL STATUS: Does the person know who they are, who you are, and where they are?      yes 5. LOCATION: What part of the head was hit?      Top of head 6. SCALP APPEARANCE: What does the scalp look like? Is it bleeding now? If Yes, ask: Is it difficult to stop?      denies 7. SIZE: For cuts, bruises, or swelling, ask: How large is it? (e.g., inches or centimeters)      denies 8. PAIN: Is there any pain? If Yes, ask: How bad is it? (Scale 0-10; or none, mild, moderate, severe)     Mild but on Monday it was severe; states attempted to go to ER but they were full 9. TETANUS: For any breaks in the skin, ask: When was your last tetanus booster?     na 10. BLOOD THINNERS: Do you take any blood thinners? (e.g., aspirin,  clopidogrel / Plavix, coumadin, heparin). Notes: Other strong blood thinners include: Arixtra (fondaparinux), Eliquis (apixaban), Pradaxa (dabigatran), and Xarelto (rivaroxaban).       denies 11. OTHER SYMPTOMS: Do you have any other symptoms? (e.g., neck pain, vomiting)       Nausea and states vomited this am 12. PREGNANCY: Is there any chance you are pregnant? When was your last menstrual period?       na  Protocols used: Head Injury-A-AH

## 2024-04-06 NOTE — Telephone Encounter (Signed)
 Noted

## 2024-05-04 ENCOUNTER — Other Ambulatory Visit: Payer: Self-pay

## 2024-05-04 ENCOUNTER — Emergency Department
Admission: EM | Admit: 2024-05-04 | Discharge: 2024-05-04 | Disposition: A | Discharge status: Home / Self Care | Payer: Self-pay | Attending: Emergency Medicine | Admitting: Emergency Medicine

## 2024-05-04 ENCOUNTER — Emergency Department: Payer: Self-pay

## 2024-05-04 DIAGNOSIS — Y990 Civilian activity done for income or pay: Secondary | ICD-10-CM | POA: Insufficient documentation

## 2024-05-04 DIAGNOSIS — M2391 Unspecified internal derangement of right knee: Secondary | ICD-10-CM | POA: Insufficient documentation

## 2024-05-04 DIAGNOSIS — M25561 Pain in right knee: Secondary | ICD-10-CM

## 2024-05-04 DIAGNOSIS — J45909 Unspecified asthma, uncomplicated: Secondary | ICD-10-CM | POA: Insufficient documentation

## 2024-05-04 DIAGNOSIS — X500XXA Overexertion from strenuous movement or load, initial encounter: Secondary | ICD-10-CM | POA: Insufficient documentation

## 2024-05-04 MED ORDER — MELOXICAM 15 MG PO TABS: 15.0000 mg | ORAL_TABLET | Freq: Every day | ORAL | 0 refills | Status: AC

## 2024-05-04 MED ORDER — IBUPROFEN 600 MG PO TABS
600.0000 mg | ORAL_TABLET | Freq: Once | ORAL | Status: AC
  Administered 2024-05-04: 600 mg via ORAL
  Filled 2024-05-04: qty 1

## 2024-05-04 NOTE — ED Triage Notes (Signed)
 Pt comes via EMS with c/o right knee pain. 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 Pt states 7/10 pain.

## 2024-05-04 NOTE — Discharge Instructions (Signed)
 Your xrays were normal.  You did not have a fracture in your bones.  You have a sprain.  You could also possibly have a meniscus tear.  If you are not improving in 1 week you should see orthopedics.  You may need a MRI which they will do as an outpatient, take meloxicam as prescribed.  Apply ice to the knee at least 3 times daily.  Bear weight as tolerated.

## 2024-05-04 NOTE — ED Provider Notes (Signed)
 [facility] Provider Note    Event Date/Time   First MD Initiated Contact with Patient 05/04/24 1614     (approximate)   History   Knee Injury   HPI  Jorge Williams. is a 38 y.o. male history of asthma presents emergency department with a right knee injury.  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  States twisted his knee and heard a loud pop.  Painful to bear weight.  No numbness or tingling.  No other injury.  No history of knee problems.      Physical Exam   Triage Vital Signs: ED Triage Vitals  Encounter Vitals Group     BP 05/04/24 1609 (!) 130/94     Girls Systolic BP Percentile --      Girls Diastolic BP Percentile --      Boys Systolic BP Percentile --      Boys Diastolic BP Percentile --      Pulse Rate 05/04/24 1609 64     Resp 05/04/24 1609 18     Temp 05/04/24 1609 99.5 F (37.5 C)     Temp Source 05/04/24 1609 Oral     SpO2 05/04/24 1609 100 %     Weight --      Height --      Head Circumference --      Peak Flow --      Pain Score 05/04/24 1626 7     Pain Loc --      Pain Education --      Exclude from Growth Chart --     Most recent vital signs: Vitals:   05/04/24 1609  BP: (!) 130/94  Pulse: 64  Resp: 18  Temp: 99.5 F (37.5 C)  SpO2: 100%     General: Awake, no distress.   CV:  Good peripheral perfusion.  Resp:  Normal effort.  Abd:  No distention.   Other:  Right knee tender at the joint line, decreased range of motion secondary to discomfort, neurovascular intact, patient guarding unable to tell if it is positive or negative drawer sign   ED Results / Procedures / Treatments   Labs (all labs ordered are listed, but only abnormal results are displayed) Labs Reviewed - No data to display   EKG     RADIOLOGY X-ray of the right knee    PROCEDURES:   Procedures  Critical Care:  no Chief Complaint  Patient presents with   Knee Injury      MEDICATIONS ORDERED IN  ED: Medications  ibuprofen  (ADVIL ) tablet 600 mg (600 mg Oral Given 05/04/24 1651)     IMPRESSION / MDM / ASSESSMENT AND PLAN / ED COURSE  I reviewed the triage vital signs and the nursing notes.                              Differential diagnosis includes, but is not limited to, fracture, contusion, sprain, meniscus tear, ligament injury  Patient's presentation is most consistent with acute illness / injury with system symptoms.   Medications given: Ibuprofen  600 mg p.o.  X-ray of the right knee, independent review interpretation as being negative for any acute abnormality.  Confirmed by radiology  I did explain findings to the patient.  States the pain is relieved somewhat with ibuprofen .  He is placed in a knee immobilizer.  Given crutches.  Ice pack.  He is to follow-up  with orthopedics if not improving in 1 week.  Return emergency department if worsening.  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  He is in agreement treatment plan.  Discharged stable condition.      FINAL CLINICAL IMPRESSION(S) / ED DIAGNOSES   Final diagnoses:  Internal derangement of right knee  Acute pain of right knee     Rx / DC Orders   ED Discharge Orders          Ordered    meloxicam (MOBIC) 15 MG tablet  Daily        05/04/24 1730             Note:  This document was prepared using Dragon voice recognition software and may include unintentional dictation errors.    Gasper Devere ORN, PA-C 05/04/24 1735    Arlander Charleston, MD 05/04/24 1754

## 2024-07-07 NOTE — ED Provider Notes (Signed)
 Chief Complaint: Shortness of Breath  Subjective  HPI  My Initial Patient Evaluation Time: 0636   Jorge Williams. is a 39 y.o. male with a PMHx of asthma, who presents to the ED with complaint of shortness of breath.  The patient explains that he has shortness of breath that started last night and has progressively gotten worse. He also reports a cough with thick, white sputum and congestion. The patient visited the ER on 07/03/24 and 07/05/24 for similar symptoms and was given magnesium and steroid prescriptions. His last dose of albuterol  was around 0600 today.  The patient has no pertinent drug allergies. He has no pertinent past surgical history. Patient denies cigarette use, EtOH use or illicit drug use.  Past Medical History[1]  Past Surgical History[2] Family History[3]  Social History[4]  No Known Allergies  Review of Systems  HENT:  Positive for congestion.   Respiratory:  Positive for cough and shortness of breath.      Objective Vitals <redacted file path>   ED Triage Vitals [07/07/24 0637] BP: (!) 142/83 Heart Rate: 93 Respiratory Rate: 20 Temp: 37.4 C (99.3 F) Temp src: n/a SpO2: 94 % Weight: 72.1 kg (159 lb) Height: 1.854 m (6' 1)  Physical Exam Vitals and nursing note reviewed.  Constitutional:      General: He is not in acute distress.    Appearance: Normal appearance. He is well-developed.  HENT:     Head: Normocephalic.  Eyes:     Conjunctiva/sclera: Conjunctivae normal.  Pulmonary:     Effort: Pulmonary effort is normal. No respiratory distress.     Breath sounds: Decreased breath sounds and wheezing present.  Musculoskeletal:        General: Normal range of motion.     Cervical back: Normal range of motion.  Neurological:     Mental Status: He is alert.     Results Review <redacted file path>   Assessment   Medical Decision Making   ED Medication Administration from 07/07/2024 0632 to 07/07/2024 9093       Date/Time Order  Dose Route Action    07/07/2024 0651 CST ipratropium-albuterol  (DUONEB) 0.5-2.5 mg/42mL nebulizer solution 1 each Nebulization Given    07/07/2024 0651 CST albuterol  (PROVENTIL ) (2.5 MG/3ML) 0.083% nebulizer solution 2.5 mg Nebulization Given      This is a 39 y.o. male who presents here today with complaints of shortness of breath. His current medical conditions include asthma. History was provided by the patient. Vital signs were reviewed while in the ER. Physical exam was significant for a few inspiratory wheezes and diminished lung sounds. The patient does not appear distressed.  My differential diagnosis included illness, asthma exacerbation. I therefore ordered laboratory work.  Labs Reviewed  SARS COV-2 WITH INFLUENZA A/B AND RSV, PCR - Abnormal; Notable for the following components:      Result Value   Influenza A RPCR DETECTED (*)    All other components within normal limits   I independently interpreted the results which were significant for Influenza A RPCR detected.   I also ordered a 2 View Xray chest. I independently interpreted the imaging which showed no focal pneumonia.   0900 Reevaluation: I discussed all findings with the patient. The patient is feeling better.   Patient is feeling better at this time.  He was not nearly as tight as he was the other day when I saw him from a respiratory standpoint.  He is positive for influenza which is probably what is  making him feel worse.  He is not hypoxic.  Will start him on Tamiflu.  He is on prednisone .  He has albuterol  inhaler.    He is stable for discharge.  He is encouraged to consider influenza vaccination later this year and in the future given his underlying respiratory status.  The patient was otherwise stable while here in the emergency department. Patient was told to absolutely return with worsening symptoms or additional concerns. He expressed understanding of his strict return precautions, as well as discharge  instructions, and all questions were answered. He will followup with his regular physician in 1-2 days for recheck of symptoms.     Final diagnoses:  Influenza A  Moderate persistent asthma with exacerbation   Plan   Disposition: Discharge I attest to having provided the patient with a Medical Screening Examination, including necessary testing or consultation, to determine whether an Emergency Medical Condition was present.  I further attest that any Emergency Medical Condition has been now stabilized or resolved with an appropriate discharge plan in place.    New Prescriptions   OSELTAMIVIR PHOSPHATE 75 MG ORAL CAPSULE (TAMIFLU)    Take 1 (one) capsule (75 mg total) by mouth 2 (two) times daily for 5 days.      Start Date: 07/07/2024  End Date: 07/12/2024      Order Dose: 75 mg      Quantity: 10 capsule    Refills: 0         Additional Documentation ED Lab and Imaging Orders XR Chest 2 Views (O'Tool, Reyes BIRCH, MD)  Scribe Attestation:  I attest that this documentation has been prepared under the direction and in the presence of O'Tool, Reyes BIRCH, MD.  Electronically Signed: Massie ONEIDA Copa, SCRIBE   Physician Attestation:  I personally performed the services described in this documentation. All medical record entries made by the scribe were at my direction and in my presence. I have reviewed the chart and discharge instructions (if applicable) and agree that the record reflects my personal performance and is accurate and complete. Electronically Signed: Reyes BIRCH Nest, MD   Procedures         [1] Past Medical History: Diagnosis Date   Asthma   [2] History reviewed. No pertinent surgical history. [3] History reviewed. No pertinent family history. [4] Social History Tobacco Use   Smoking status: Never   Smokeless tobacco: Never  Vaping Use   Vaping status: Some Days   Substances: THC  Substance Use Topics   Alcohol use: Yes    Comment: Occ.   Drug use:  Yes    Types: Marijuana   Nest Reyes BIRCH, MD 07/07/24 1259

## 2024-07-13 ENCOUNTER — Encounter: Payer: Self-pay | Admitting: Emergency Medicine

## 2024-07-13 ENCOUNTER — Emergency Department
Admission: EM | Admit: 2024-07-13 | Discharge: 2024-07-13 | Disposition: A | Discharge status: Home / Self Care | Payer: Self-pay | Attending: Emergency Medicine | Admitting: Emergency Medicine

## 2024-07-13 ENCOUNTER — Other Ambulatory Visit: Payer: Self-pay

## 2024-07-13 DIAGNOSIS — Z23 Encounter for immunization: Secondary | ICD-10-CM | POA: Insufficient documentation

## 2024-07-13 DIAGNOSIS — S71132A Puncture wound without foreign body, left thigh, initial encounter: Secondary | ICD-10-CM | POA: Insufficient documentation

## 2024-07-13 DIAGNOSIS — W540XXA Bitten by dog, initial encounter: Secondary | ICD-10-CM | POA: Insufficient documentation

## 2024-07-13 MED ORDER — AMOXICILLIN-POT CLAVULANATE 875-125 MG PO TABS: 1.0000 | ORAL_TABLET | Freq: Two times a day (BID) | ORAL | 0 refills | Status: AC

## 2024-07-13 MED ORDER — AMOXICILLIN-POT CLAVULANATE 875-125 MG PO TABS
1.0000 | ORAL_TABLET | Freq: Once | ORAL | Status: AC
  Administered 2024-07-13: 1 via ORAL
  Filled 2024-07-13: qty 1

## 2024-07-13 MED ORDER — BACITRACIN ZINC 500 UNIT/GM EX OINT
TOPICAL_OINTMENT | Freq: Two times a day (BID) | CUTANEOUS | Status: DC
  Administered 2024-07-13: 1 via TOPICAL
  Filled 2024-07-13: qty 0.9

## 2024-07-13 MED ORDER — TETANUS-DIPHTH-ACELL PERTUSSIS 5-2-15.5 LF-MCG/0.5 IM SUSP
0.5000 mL | Freq: Once | INTRAMUSCULAR | Status: AC
  Administered 2024-07-13: 0.5 mL via INTRAMUSCULAR
  Filled 2024-07-13: qty 0.5

## 2024-07-13 NOTE — Discharge Instructions (Addendum)
 You were seen in the emergency department for an animal bite.  Please pick up and take the antibiotics as prescribed.  You may pick up any over-the-counter probiotic to take with the antibiotic to help with your stomach if it causes you to diarrhea.  You can take it with food.  You may alternate Tylenol  and ibuprofen  over-the-counter to help with pain unless your doctor has previously told you not to take these medications.  Return to the emergency department if you experience worsening swelling, redness, puslike drainage, fever or chills after starting the antibiotics.  Wash the area with soap and water  daily.  Follow-up with animal control following today's visit.

## 2024-07-13 NOTE — ED Notes (Signed)
 Patient stated his employer called animal control and that they were told the dog is up to date on the shots.

## 2024-07-13 NOTE — ED Provider Notes (Signed)
 "  [facility] Provider Note    Event Date/Time   First MD Initiated Contact with Patient 07/13/24 1844     (approximate)   History   Animal Bite   HPI  Jorge Williams. is a 39 y.o. male  with a past medical history of asthma, anxiety, depression, presents to the emergency department with dog bite to the left knee that occurred approximately 4 hours ago.  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  He is unsure if he is up-to-date on his tetanus vaccine.  Patient reports his employer did call animal control and reports the dog is up-to-date on rabies vaccines at this time and can be isolated at home.  No medication allergies. Denies any other injuries.  Patient is not filing Worker's Comp at this time.  I reviewed the medical chart.  Patient recently diagnosed with influenza on 1-1 and currently completing Tamiflu.  Physical Exam   Triage Vital Signs: ED Triage Vitals [07/13/24 1706]  Encounter Vitals Group     BP 133/67     Girls Systolic BP Percentile      Girls Diastolic BP Percentile      Boys Systolic BP Percentile      Boys Diastolic BP Percentile      Pulse Rate 76     Resp 18     Temp 99.1 F (37.3 C)     Temp Source Oral     SpO2 99 %     Weight      Height      Head Circumference      Peak Flow      Pain Score 0     Pain Loc      Pain Education      Exclude from Growth Chart     Most recent vital signs: Vitals:   07/13/24 1706  BP: 133/67  Pulse: 76  Resp: 18  Temp: 99.1 F (37.3 C)  SpO2: 99%    General: Awake, in no acute distress.  CV: Good peripheral perfusion. PT pulses 2+ b/l. Respiratory:Normal respiratory effort.  No respiratory distress.  GI: Soft, non-distended. Skin:Warm, dry, intact. Small puncture wound on distal left anterior thigh, no bleeding or pus-like discharge.  ED Results / Procedures / Treatments   Labs (all labs ordered are listed, but  only abnormal results are displayed) Labs Reviewed - No data to display   EKG     RADIOLOGY    PROCEDURES:  Critical Care performed: No   Procedures   MEDICATIONS ORDERED IN ED: Medications  amoxicillin -clavulanate (AUGMENTIN ) 875-125 MG per tablet 1 tablet (has no administration in time range)  Tdap (ADACEL ) injection 0.5 mL (has no administration in time range)  bacitracin  ointment (has no administration in time range)     IMPRESSION / MDM / ASSESSMENT AND PLAN / ED COURSE  I reviewed the triage vital signs and the nursing notes.                              Differential diagnosis includes, but is not limited to, animal bite, laceration, abrasion  Patient's presentation is most consistent with acute complicated illness / injury requiring diagnostic workup.  Patient is a 39 year old male presenting following a dog bite today.  Tetanus vaccine was updated.  Small puncture wound noted to the left anterior thigh.  Patient not  in any pain at this time.  No evidence of wound infection.  Dog is up-to-date on rabies vaccinations per the patient's employer who already contacted animal control.  Affected area cleansed with soap and water .  Discussed wound care instructions for going home.  Provided Augmentin , bacitracin  ointment, discussed OTC pain medicine use as needed.  He can follow-up with his PCP outpatient as needed.  He was informed to return to the ER if he develops any signs of infection after 24 hours with antibiotics.  The patient may return to the emergency department for any new, worsening, or concerning symptoms. Patient was given the opportunity to ask questions; all questions were answered. Emergency department return precautions were discussed with the patient.  Patient is in agreement to the treatment plan.  Patient is stable for discharge.   FINAL CLINICAL IMPRESSION(S) / ED DIAGNOSES   Final diagnoses:  Dog bite, initial encounter     Rx / DC Orders   ED  Discharge Orders          Ordered    amoxicillin -clavulanate (AUGMENTIN ) 875-125 MG tablet  2 times daily        07/13/24 1929             Note:  This document was prepared using Dragon voice recognition software and may include unintentional dictation errors.     Sheron Salm, PA-C 07/13/24 1937    Viviann Pastor, MD 07/13/24 2258  "

## 2024-07-13 NOTE — ED Triage Notes (Addendum)
 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 Unknown if dog is up to date on rabies vaccines.
